# Patient Record
Sex: Female | Born: 1955
Health system: Southern US, Community
[De-identification: ages and names within clinical notes are randomized; demographics above are authoritative.]

## PROBLEM LIST (undated history)

## (undated) DIAGNOSIS — K5790 Diverticulosis of intestine, part unspecified, without perforation or abscess without bleeding: Secondary | ICD-10-CM

## (undated) DIAGNOSIS — D649 Anemia, unspecified: Secondary | ICD-10-CM

## (undated) DIAGNOSIS — I10 Essential (primary) hypertension: Secondary | ICD-10-CM

## (undated) DIAGNOSIS — T7840XA Allergy, unspecified, initial encounter: Secondary | ICD-10-CM

## (undated) HISTORY — DX: Allergy, unspecified, initial encounter: T78.40XA

## (undated) HISTORY — PX: TONSILLECTOMY: SUR1361

## (undated) HISTORY — DX: Essential (primary) hypertension: I10

## (undated) HISTORY — PX: COLONOSCOPY: SHX174

## (undated) HISTORY — PX: TRIGGER FINGER RELEASE: SHX641

## (undated) HISTORY — PX: GANGLION CYST EXCISION: SHX1691

## (undated) HISTORY — PX: OTHER SURGICAL HISTORY: SHX169

## (undated) HISTORY — PX: BREAST BIOPSY: SHX20

---

## 2007-08-01 ENCOUNTER — Ambulatory Visit: Payer: Self-pay | Admitting: Gastroenterology

## 2011-12-22 ENCOUNTER — Ambulatory Visit: Payer: Self-pay | Admitting: Surgery

## 2011-12-23 LAB — PATHOLOGY REPORT

## 2012-06-21 ENCOUNTER — Ambulatory Visit: Payer: Self-pay | Admitting: Surgery

## 2013-02-14 ENCOUNTER — Ambulatory Visit: Payer: Self-pay | Admitting: Surgery

## 2013-08-22 ENCOUNTER — Ambulatory Visit: Payer: Self-pay | Admitting: Orthopedic Surgery

## 2013-08-24 LAB — PATHOLOGY REPORT

## 2013-12-13 HISTORY — PX: BREAST BIOPSY: SHX20

## 2013-12-13 HISTORY — PX: BREAST EXCISIONAL BIOPSY: SUR124

## 2014-05-30 DIAGNOSIS — G5600 Carpal tunnel syndrome, unspecified upper limb: Secondary | ICD-10-CM | POA: Insufficient documentation

## 2014-07-22 ENCOUNTER — Encounter: Payer: Self-pay | Admitting: Orthopedic Surgery

## 2014-08-13 ENCOUNTER — Encounter: Payer: Self-pay | Admitting: Orthopedic Surgery

## 2014-09-09 ENCOUNTER — Ambulatory Visit: Payer: Self-pay | Admitting: Obstetrics and Gynecology

## 2014-09-16 ENCOUNTER — Ambulatory Visit: Payer: Self-pay | Admitting: Obstetrics and Gynecology

## 2014-09-23 ENCOUNTER — Ambulatory Visit: Payer: Self-pay | Admitting: Obstetrics and Gynecology

## 2014-09-25 LAB — PATHOLOGY REPORT

## 2014-09-26 ENCOUNTER — Emergency Department: Payer: Self-pay | Admitting: Internal Medicine

## 2014-10-24 LAB — HM COLONOSCOPY

## 2014-11-18 ENCOUNTER — Ambulatory Visit: Payer: Self-pay | Admitting: Surgery

## 2014-11-22 ENCOUNTER — Observation Stay: Payer: Self-pay | Admitting: Surgery

## 2014-11-22 ENCOUNTER — Ambulatory Visit: Payer: Self-pay | Admitting: Surgery

## 2014-11-22 LAB — CBC WITH DIFFERENTIAL/PLATELET
Basophil #: 0 10*3/uL (ref 0.0–0.1)
Basophil %: 0.7 %
Eosinophil #: 0 10*3/uL (ref 0.0–0.7)
Eosinophil %: 0 %
HCT: 40.2 % (ref 35.0–47.0)
HGB: 12.4 g/dL (ref 12.0–16.0)
LYMPHS ABS: 0.6 10*3/uL — AB (ref 1.0–3.6)
Lymphocyte %: 8.5 %
MCH: 25.7 pg — AB (ref 26.0–34.0)
MCHC: 30.8 g/dL — AB (ref 32.0–36.0)
MCV: 83 fL (ref 80–100)
MONO ABS: 0.1 x10 3/mm — AB (ref 0.2–0.9)
Monocyte %: 0.8 %
Neutrophil #: 6.1 10*3/uL (ref 1.4–6.5)
Neutrophil %: 90 %
Platelet: 285 10*3/uL (ref 150–440)
RBC: 4.82 10*6/uL (ref 3.80–5.20)
RDW: 14 % (ref 11.5–14.5)
WBC: 6.7 10*3/uL (ref 3.6–11.0)

## 2014-11-22 LAB — BASIC METABOLIC PANEL
Anion Gap: 9 (ref 7–16)
BUN: 13 mg/dL (ref 7–18)
CO2: 25 mmol/L (ref 21–32)
Calcium, Total: 8.5 mg/dL (ref 8.5–10.1)
Chloride: 107 mmol/L (ref 98–107)
Creatinine: 1.02 mg/dL (ref 0.60–1.30)
EGFR (African American): >60
GFR CALC NON AF AMER: 59 — AB
Glucose: 164 mg/dL — ABNORMAL HIGH (ref 65–99)
Osmolality: 285 (ref 275–301)
POTASSIUM: 4.1 mmol/L (ref 3.5–5.1)
Sodium: 141 mmol/L (ref 136–145)

## 2014-11-22 LAB — PROTIME-INR
INR: 1
Prothrombin Time: 12.8 secs (ref 11.5–14.7)

## 2015-04-04 NOTE — Op Note (Signed)
PATIENT NAME:  Chelsea [name], Chelsea [name] MR#:  [id number] DATE OF BIRTH:  19-Jun-1956  DATE OF PROCEDURE:  08/23/2013  PREOPERATIVE DIAGNOSIS: Right volar ganglion cyst.   POSTOPERATIVE DIAGNOSIS:  Right volar ganglion cyst.   PROCEDURE: Excision right volar ganglion cyst.   ANESTHESIA: General.   SURGEON: Leitha SchullerMichael J. Laticha Ferrucci, M.D.   DESCRIPTION OF PROCEDURE: The patient was brought to the operating room and after adequate anesthesia was obtained, the right arm was prepped and draped in the usual sterile fashion with a tourniquet applied to the upper arm. After patient identification and timeout procedures were completed. The tourniquet was raised to 250 mmHg. A curvilinear volar incision was made at the base of the scaphoid tubercle and extending extended proximally. The subcutaneous tissue was spread and the ganglion identified, grasped and then tracked down to the radial scaphoid joint. It  was then removed and its base cauterized to try to prevent recurrence. The tourniquet was let down at this point and the radial artery was intact. The cyst itself rose just ulnar to the FCR tendon. The wound was then irrigated and closed with simple interrupted 5-0 nylon skin sutures. Xeroform, 4 x 4's, Webril and a volar splint were applied along with an Ace wrap, and the patient was sent to the recovery room in stable condition.   ESTIMATED BLOOD LOSS: Minimal.   TOURNIQUET TIME: Eight minutes at 250 mmHg.   SPECIMEN: Removed ganglion cyst.   ____________________________ Leitha SchullerMichael J. Latise Dilley, MD mjm:cc D: 08/23/2013 02:02:12 ET [name]: 08/23/2013 03:02:25 ET JOB#: [id number]  cc: Leitha SchullerMichael J. Janika Jedlicka, MD, <Dictator> Leitha SchullerMICHAEL J Bishoy Cupp MD ELECTRONICALLY SIGNED 08/23/2013 11:12

## 2015-04-05 NOTE — Op Note (Signed)
PATIENT NAME:  [name], Chelsea T MR#:  [id number] DATE OF BIRTH:  11-22-1956  DATE OF PROCEDURE:  11/22/2014  PREOPERATIVE DIAGNOSIS: Intraductal papilloma of the right breast.   POSTOPERATIVE DIAGNOSIS: Intraductal papilloma of the right breast.   PROCEDURE: Excision of right breast mass.   SURGEON: Adella HareJ. Wilton Samina Weekes, MD.   ANESTHESIA: General.   INDICATIONS: This 59 year old female recently had a mammogram depicting a small density in the medial aspect of the right breast in the lower inner quadrant. Ultrasound demonstrated a 6 x 5 x 6 mm hypoechoic mass with indistinct margins. Ultrasound-guided needle biopsy demonstrated intraductal papilloma associated with microcalcifications. Excision of the mass was recommended for further treatment. She did have preoperative insertion of a Kopans wire with followup mammograms, which I have reviewed demonstrating location of the biopsy marker and the Kopans wire in the medial aspect of the right breast.   The patient was placed on the operating table in the supine position under general anesthesia. The dressing was removed from the right breast exposing the Kopans wire which entered the peripheral aspect of the breast at approximately 2:30 position and extended laterally and identified the close proximity of the wire to the biopsy marker. The wire was cut 2 cm from the skin. The breast was prepared with ChloraPrep and draped in a sterile manner.   A curvilinear incision was made from approximately 2-o'clock to 4-o'clock position of the right breast which was approximately 4 cm in length, carried down through subcutaneous tissues and dissected down to encounter the wire. Next, a portion of tissue surrounding the wire was resected. This did extend from medial to lateral. There was a pocket of what appeared to be old clotted blood, which this entire pocket was excised and the actual specimen was some 5 cm in length and approximately 1.5 cm in width and was  submitted for specimen mammogram and routine pathology. The wound was inspected. Several small bleeding points were cauterized. Some of the tissues in the retroareolar portion were approximated with 4-0 chromic. The subcutaneous tissues were infiltrated with 0.5% Sensorcaine with epinephrine. The subcutaneous tissues were approximated with 3-0 chromic. The skin was closed with running 4-0 Monocryl subcuticular suture and LiquiBand. The LiquiBand was allowed to dry. The patient appeared to tolerate the procedure satisfactorily and was prepared for transfer to the recovery room.   ____________________________ [name]. Renda RollsWilton Contrina Orona, MD jws:at D: 11/22/2014 11:17:07 ET T: 11/22/2014 17:45:59 ET JOB#: [id number]  cc: Adella HareJ. Wilton Kelse Ploch, MD, <Dictator> Adella HareWILTON J John Williamsen MD ELECTRONICALLY SIGNED 11/22/2014 19:28

## 2015-04-05 NOTE — Op Note (Signed)
PATIENT NAME:  [name], Chelsea [name] DATE OF BIRTH:  April 08, 1956  DATE OF PROCEDURE:  11/22/2014  PREOPERATIVE DIAGNOSIS: Hematoma of right breast.   POSTOPERATIVE DIAGNOSIS: Hematoma of right breast.   PROCEDURE: Incision and drainage of hematoma of right breast.   SURGEON: Renda RollsWilton Smith, MD.   ANESTHESIA: General.   INDICATIONS: This 59 year old female had had surgery earlier in the day to remove an intraductal papilloma of the medial aspect of the right breast. She called from home indicating that she had a lot of swelling and some bleeding and was brought in through the Emergency Room and brought to the operating room with marked swelling of the right breast.   DESCRIPTION OF PROCEDURE:  The patient was placed on the operating table in the supine position under general anesthesia. The right breast was prepared with ChloraPrep and draped in a sterile manner.   There was a lot of swelling of the right breast. The glue was removed from the incision of the medial aspect of the right breast. This curvilinear incision was just outside of the areola and medial to the areola and approximately 4 cm in length. The incision was opened with a scalpel and evacuated a large amount of clotted blood, also aspirated some non-clotted blood. The wound was inspected and did not see any active bleeding point. All of the hematoma was evacuated which the combination of clotted blood and liquid blood which was aspirated amounted to approximately 150 mL. The wound was inspected over a period of several minutes. Several tiny points were coagulated but there was no active bleeding seen. A Blake drain was inserted through a separate inferior medial stab wound and cut to fit and secured to the skin with 3-0 nylon stitch. The subcutaneous tissues were closed with interrupted 4-0 Chromic, the skin was closed with running 4-0 Monocryl subcuticular suture and LiquiBand. Dressing was applied around the drain site using  silk tape to hold it in place.   The patient appeared to tolerate the procedure satisfactorily and was prepared for transfer to the recovery room.    ____________________________ [name]. Renda RollsWilton Smith, MD jws:bu D: 11/25/2014 17:50:00 ET T: 11/25/2014 21:14:15 ET JOB#: [id number]  cc: Adella HareJ. Wilton Smith, MD, <Dictator>

## 2015-04-05 NOTE — Op Note (Signed)
PATIENT NAME:  Chelsea [name], Chelsea [name] MR#:  [id number] DATE OF BIRTH:  1956/11/04  DATE OF PROCEDURE:  11/22/2014  PREOPERATIVE DIAGNOSIS: Hematoma of right breast.   POSTOPERATIVE DIAGNOSIS: Hematoma of right breast.   PROCEDURE: Incision and drainage of hematoma of right breast.   SURGEON: Renda RollsWilton Charod Slawinski, MD.   ANESTHESIA: General.   INDICATIONS: This 59 year old female had had surgery earlier in the day to remove an intraductal papilloma of the medial aspect of the right breast. She called from home indicating that she had a lot of swelling and some bleeding and was brought in through the Emergency Room and brought to the operating room with marked swelling of the right breast.   DESCRIPTION OF PROCEDURE:  The patient was placed on the operating table in the supine position under general anesthesia. The right breast was prepared with ChloraPrep and draped in a sterile manner.   There was a lot of swelling of the right breast. The glue was removed from the incision of the medial aspect of the right breast. This curvilinear incision was just outside of the areola and medial to the areola and approximately 4 cm in length. The incision was opened with a scalpel and evacuated a large amount of clotted blood, also aspirated some non-clotted blood. The wound was inspected and did not see any active bleeding point. All of the hematoma was evacuated which the combination of clotted blood and liquid blood which was aspirated amounted to approximately 150 mL. The wound was inspected over a period of several minutes. Several tiny points were coagulated but there was no active bleeding seen. A Blake drain was inserted through a separate inferior medial stab wound and cut to fit and secured to the skin with 3-0 nylon stitch. The subcutaneous tissues were closed with interrupted 4-0 Chromic, the skin was closed with running 4-0 Monocryl subcuticular suture and LiquiBand. Dressing was applied around the drain site using  silk tape to hold it in place.   The patient appeared to tolerate the procedure satisfactorily and was prepared for transfer to the recovery room.     ____________________________ [name]. Renda RollsWilton Azul Coffie, MD jws:bu D: 11/25/2014 17:50:51 ET [name]: 11/25/2014 21:15:46 ET JOB#: [id number]  cc: Adella HareJ. Wilton Rolene Andrades, MD, <Dictator> Adella HareWILTON J Delphia Kaylor MD ELECTRONICALLY SIGNED 11/28/2014 9:36

## 2015-04-07 LAB — SURGICAL PATHOLOGY

## 2016-02-07 DIAGNOSIS — Y9289 Other specified places as the place of occurrence of the external cause: Secondary | ICD-10-CM | POA: Insufficient documentation

## 2016-02-07 DIAGNOSIS — S8992XA Unspecified injury of left lower leg, initial encounter: Secondary | ICD-10-CM | POA: Diagnosis present

## 2016-02-07 DIAGNOSIS — Z79899 Other long term (current) drug therapy: Secondary | ICD-10-CM | POA: Diagnosis not present

## 2016-02-07 DIAGNOSIS — S83207A Unspecified tear of unspecified meniscus, current injury, left knee, initial encounter: Secondary | ICD-10-CM | POA: Diagnosis not present

## 2016-02-07 DIAGNOSIS — Y998 Other external cause status: Secondary | ICD-10-CM | POA: Insufficient documentation

## 2016-02-07 DIAGNOSIS — Y9389 Activity, other specified: Secondary | ICD-10-CM | POA: Insufficient documentation

## 2016-02-07 DIAGNOSIS — X58XXXA Exposure to other specified factors, initial encounter: Secondary | ICD-10-CM | POA: Diagnosis not present

## 2016-02-08 ENCOUNTER — Emergency Department: Payer: BLUE CROSS/BLUE SHIELD

## 2016-02-08 ENCOUNTER — Encounter: Payer: Self-pay | Admitting: Emergency Medicine

## 2016-02-08 ENCOUNTER — Emergency Department
Admission: EM | Admit: 2016-02-08 | Discharge: 2016-02-08 | Disposition: A | Discharge status: Home / Self Care | Payer: BLUE CROSS/BLUE SHIELD | Attending: Emergency Medicine | Admitting: Emergency Medicine

## 2016-02-08 DIAGNOSIS — M2392 Unspecified internal derangement of left knee: Secondary | ICD-10-CM

## 2016-02-08 DIAGNOSIS — M79652 Pain in left thigh: Secondary | ICD-10-CM

## 2016-02-08 MED ORDER — OXYCODONE-ACETAMINOPHEN 5-325 MG PO TABS
ORAL_TABLET | ORAL | Status: AC
  Filled 2016-02-08: qty 1

## 2016-02-08 MED ORDER — OXYCODONE-ACETAMINOPHEN 5-325 MG PO TABS
1.0000 | ORAL_TABLET | Freq: Once | ORAL | Status: AC
  Administered 2016-02-08: 1 via ORAL

## 2016-02-08 NOTE — ED Notes (Signed)
Pt states at 2100 pt was ambulating when she went to take a step up a raised step when she felt a "pop" in her right thigh. Pt states now is unable to bear weight on leg and has pain in right thigh. Cms intact to toes, no deformity or swelling noted.

## 2016-02-08 NOTE — ED Provider Notes (Signed)
[facility] Emergency Department Provider Note  ____________________________________________  Time seen: Approximately 1:59 AM  I have reviewed the triage vital signs and the nursing notes.   HISTORY  Chief Complaint Leg Pain    HPI TAMALYN WADSWORTH is a 60 y.o. female who is generally healthy other than a history of obesity who presents with acute onset of pain and swelling just above her left knee.  She reports that her knee has been sore recently but it is generally behind her knee.  However earlier tonight she was walking up a couple of steps and she felt a pop just above the knee and had acute onset of intense, severe pain.  She feels like she is unable to bend her knee due to the pain and she cannot bear weight on it.  Flexing the knee worsens the pain and keeping it still and straight makes it feel better.She did not sustain any other injuries and has no pain in any other locations.  She did have immediate swelling just above the left knee.  She denies fever/chills, chest pain, shortness of breath, abdominal pain, nausea, vomiting, dysuria.  She denies any orthopedic injuries in the past although her past surgical history does indicate a nonspecific arm surgery.   History reviewed. No pertinent past medical history.  There are no active problems to display for this patient.   Past Surgical History  Procedure Laterality Date  . Arm surgery      Current Outpatient Rx  Name  Route  Sig  Dispense  Refill  . calcium carbonate (OS-CAL - DOSED IN MG OF ELEMENTAL CALCIUM) 1250 (500 Ca) MG tablet   Oral   Take 1 tablet by mouth daily.         . cholecalciferol (VITAMIN D) 1000 units tablet   Oral   Take 1,000 Units by mouth daily.           Allergies Review of patient's allergies indicates no known allergies.  No family history on file.  Social History Social History  Substance Use Topics  . Smoking status: Never Smoker   . Smokeless tobacco:  Never Used  . Alcohol Use: No    Review of Systems Constitutional: No fever/chills Eyes: No visual changes. ENT: No sore throat. Cardiovascular: Denies chest pain. Respiratory: Denies shortness of breath. Gastrointestinal: No abdominal pain.  No nausea, no vomiting.  No diarrhea.  No constipation. Genitourinary: Negative for dysuria. Musculoskeletal: Acute onset severe pain and swelling superior to the left patella when walking up stairs Skin: Negative for rash. Neurological: Negative for headaches, focal weakness or numbness.  10-point ROS otherwise negative.  ____________________________________________   PHYSICAL EXAM:  VITAL SIGNS: ED Triage Vitals  Enc Vitals Group     BP 02/08/16 0010 162/91 mmHg     Pulse Rate 02/08/16 0010 70     Resp 02/08/16 0010 16     Temp 02/08/16 0010 98.1 F (36.7 C)     Temp Source 02/08/16 0010 Oral     SpO2 02/08/16 0010 98 %     Weight 02/08/16 0010 180 lb (81.647 kg)     Height 02/08/16 0010 5' (1.524 m)     Head Cir --      Peak Flow --      Pain Score 02/08/16 0011 8     Pain Loc --      Pain Edu? --      Excl. in GC? --     Constitutional: Alert and  oriented. Well appearing and in no acute distress. Eyes: Conjunctivae are normal. PERRL. EOMI. Head: Atraumatic. Cardiovascular: Normal rate, regular rhythm. Grossly normal heart sounds.  Good peripheral circulation. Respiratory: Normal respiratory effort.  No retractions. Lungs CTAB. Gastrointestinal: Soft and nontender. No distention. No abdominal bruits. No CVA tenderness. Musculoskeletal: Suprapatellar effusion of the left leg with no erythema and no warmth that would suggest an infectious process.  It is moderately tender to palpation.  She has severe tenderness with passive flexion of the knee.  There is no pain or tenderness in the femur or the left hip. Neurologic:  Normal speech and language. No gross focal neurologic deficits are appreciated.  Skin:  Skin is warm, dry and  intact. No rash noted. Psychiatric: Mood and affect are normal. Speech and behavior are normal.  ____________________________________________   LABS (all labs ordered are listed, but only abnormal results are displayed)  Labs Reviewed - No data to display ____________________________________________  EKG  None ____________________________________________  RADIOLOGY   Dg Knee Complete 4 Views Left  02/08/2016  CLINICAL DATA:  Felt pop in left knee while going up steps, with pain and swelling above the patella. Initial encounter. EXAM: LEFT KNEE - COMPLETE 4+ VIEW COMPARISON:  None. FINDINGS: There is no evidence of fracture or dislocation. The joint spaces are preserved. No significant degenerative change is seen; the patellofemoral joint is grossly unremarkable in appearance. A large knee joint effusion is noted. Mild edema is noted at Hoffa's fat pad. IMPRESSION: 1. No evidence of fracture or dislocation. 2. Large knee joint effusion noted. Mild edema at Hoffa's fat pad. MRI could be considered for further evaluation, to assess for internal derangement of the knee. Electronically Signed   By: Roanna Raider M.D.   On: 02/08/2016 02:28    ____________________________________________   PROCEDURES  Procedure(s) performed: None  Critical Care performed: No ____________________________________________   INITIAL IMPRESSION / ASSESSMENT AND PLAN / ED COURSE  Pertinent labs & imaging results that were available during my care of the patient were reviewed by me and considered in my medical decision making (see chart for details).  The patient has no bony abnormality or dislocation on her x-ray but it does demonstrate the same large effusion that we can see clinically on exam.  I called and spoke with Dr. Rexanne Mano who recommended a knee immobilizer and close outpatient follow-up within the next business day.  I advised the patient of these recommendations and she knows to follow up on  Monday morning.  I gave my usual and customary return precautions.     ____________________________________________  FINAL CLINICAL IMPRESSION(S) / ED DIAGNOSES  Final diagnoses:  Internal derangement of knee, left      NEW MEDICATIONS STARTED DURING THIS VISIT:  New Prescriptions   No medications on file      Note:  This document was prepared using Dragon voice recognition software and may include unintentional dictation errors.   Loleta Rose, MD 02/08/16 0430

## 2016-02-08 NOTE — ED Notes (Signed)
Pt discharged to home w/ crutches and knee immobilizer by Quentin Mulling, RN.

## 2016-02-08 NOTE — ED Notes (Signed)
MD Forbach at bedside. 

## 2016-02-08 NOTE — Discharge Instructions (Signed)
As we discussed, you need to follow-up with Dr. Rosita Kea on Monday morning for reevaluation of your left knee injury.  Please use the knee immobilizer as much as possible between now and then.  Use over-the-counter pain medication as needed.

## 2016-02-08 NOTE — ED Notes (Signed)
Pt c/o of pain/swelling immediately superior to the left knee. Pt reports she was walking at approx 9pm and heard a popping noise. She has had intense pain, and inability to bear weight on leg since.

## 2016-02-23 ENCOUNTER — Other Ambulatory Visit: Payer: Self-pay | Admitting: Orthopedic Surgery

## 2016-02-23 DIAGNOSIS — S83232A Complex tear of medial meniscus, current injury, left knee, initial encounter: Secondary | ICD-10-CM

## 2016-03-12 ENCOUNTER — Ambulatory Visit
Admission: RE | Admit: 2016-03-12 | Discharge: 2016-03-12 | Disposition: A | Discharge status: Home / Self Care | Payer: BLUE CROSS/BLUE SHIELD | Source: Ambulatory Visit | Attending: Orthopedic Surgery | Admitting: Orthopedic Surgery

## 2016-03-12 DIAGNOSIS — S83232A Complex tear of medial meniscus, current injury, left knee, initial encounter: Secondary | ICD-10-CM | POA: Diagnosis not present

## 2016-03-12 DIAGNOSIS — M25462 Effusion, left knee: Secondary | ICD-10-CM | POA: Insufficient documentation

## 2016-03-12 DIAGNOSIS — X58XXXA Exposure to other specified factors, initial encounter: Secondary | ICD-10-CM | POA: Diagnosis not present

## 2016-04-15 ENCOUNTER — Encounter: Payer: Self-pay | Admitting: *Deleted

## 2016-04-15 ENCOUNTER — Other Ambulatory Visit: Payer: BLUE CROSS/BLUE SHIELD

## 2016-04-15 MED ORDER — SILVER SULFADIAZINE 1 % EX CREA
TOPICAL_CREAM | CUTANEOUS | Status: AC
  Filled 2016-04-15: qty 85

## 2016-04-15 NOTE — Patient Instructions (Signed)
  Your procedure is scheduled on:04-22-16 Report to [facility] 2ND FLOOR To find out your arrival time please call [phone] between 1PM - 3PM on 04-21-16  Remember: Instructions that are not followed completely may result in serious medical risk, up to and including death, or upon the discretion of your surgeon and anesthesiologist your surgery may need to be rescheduled.    _X___ 1. Do not eat food or drink liquids after midnight. No gum chewing or hard candies.     _X___ 2. No Alcohol for 24 hours before or after surgery.   ____ 3. Bring all medications with you on the day of surgery if instructed.    ____ 4. Notify your doctor if there is any change in your medical condition     (cold, fever, infections).     Do not wear jewelry, make-up, hairpins, clips or nail polish.  Do not wear lotions, powders, or perfumes. You may wear deodorant.  Do not shave 48 hours prior to surgery. Men may shave face and neck.  Do not bring valuables to the hospital.    [facility] is not responsible for any belongings or valuables.               Contacts, dentures or bridgework may not be worn into surgery.  Leave your suitcase in the car. After surgery it may be brought to your room.  For patients admitted to the hospital, discharge time is determined by your treatment team.   Patients discharged the day of surgery will not be allowed to drive home.   Please read over the following fact sheets that you were given:      ____ Take these medicines the morning of surgery with A SIP OF WATER:    1. NONE  2.   3.   4.  5.  6.  ____ Fleet Enema (as directed)   ____ Use CHG Soap as directed  ____ Use inhalers on the day of surgery  ____ Stop metformin 2 days prior to surgery    ____ Take 1/2 of usual insulin dose the night before surgery and none on the morning of surgery.   ____ Stop Coumadin/Plavix/aspirin-N/A  _X___ Stop Anti-inflammatories-NO NSAIDS OR ASA  PRODUCTS-TYLENOL OK TO TAKE   _X___ Stop supplements until after surgery-STOP FISH OIL NOW  ____ Bring C-Pap to the hospital.

## 2016-04-22 ENCOUNTER — Encounter
Admission: RE | Disposition: A | Discharge status: Home / Self Care | Payer: Self-pay | Source: Ambulatory Visit | Attending: Orthopedic Surgery

## 2016-04-22 ENCOUNTER — Ambulatory Visit: Payer: BLUE CROSS/BLUE SHIELD | Admitting: Anesthesiology

## 2016-04-22 ENCOUNTER — Ambulatory Visit
Admission: RE | Admit: 2016-04-22 | Discharge: 2016-04-22 | Disposition: A | Discharge status: Home / Self Care | Payer: BLUE CROSS/BLUE SHIELD | Source: Ambulatory Visit | Attending: Orthopedic Surgery | Admitting: Orthopedic Surgery

## 2016-04-22 DIAGNOSIS — M23222 Derangement of posterior horn of medial meniscus due to old tear or injury, left knee: Secondary | ICD-10-CM | POA: Insufficient documentation

## 2016-04-22 DIAGNOSIS — I10 Essential (primary) hypertension: Secondary | ICD-10-CM | POA: Diagnosis not present

## 2016-04-22 DIAGNOSIS — Z79899 Other long term (current) drug therapy: Secondary | ICD-10-CM | POA: Diagnosis not present

## 2016-04-22 HISTORY — DX: Diverticulosis of intestine, part unspecified, without perforation or abscess without bleeding: K57.90

## 2016-04-22 HISTORY — PX: KNEE ARTHROSCOPY WITH MEDIAL MENISECTOMY: SHX5651

## 2016-04-22 SURGERY — ARTHROSCOPY, KNEE, WITH MEDIAL MENISCECTOMY
Anesthesia: General | Site: Knee | Laterality: Left | Wound class: Clean

## 2016-04-22 MED ORDER — ONDANSETRON HCL 4 MG/2ML IJ SOLN
4.0000 mg | Freq: Once | INTRAMUSCULAR | Status: DC | PRN
Start: 2016-04-22 — End: 2016-04-22

## 2016-04-22 MED ORDER — LIDOCAINE HCL (CARDIAC) 20 MG/ML IV SOLN
INTRAVENOUS | Status: DC | PRN
  Administered 2016-04-22: 100 mg via INTRAVENOUS

## 2016-04-22 MED ORDER — PHENYLEPHRINE HCL 10 MG/ML IJ SOLN
INTRAMUSCULAR | Status: DC | PRN
  Administered 2016-04-22: 100 ug via INTRAVENOUS

## 2016-04-22 MED ORDER — MIDAZOLAM HCL 2 MG/2ML IJ SOLN
INTRAMUSCULAR | Status: DC | PRN
  Administered 2016-04-22: 2 mg via INTRAVENOUS

## 2016-04-22 MED ORDER — LACTATED RINGERS IV SOLN
INTRAVENOUS | Status: DC
  Administered 2016-04-22 (×2): via INTRAVENOUS

## 2016-04-22 MED ORDER — KETOROLAC TROMETHAMINE 30 MG/ML IJ SOLN
INTRAMUSCULAR | Status: DC | PRN
  Administered 2016-04-22: 30 mg via INTRAVENOUS

## 2016-04-22 MED ORDER — FENTANYL CITRATE (PF) 100 MCG/2ML IJ SOLN
INTRAMUSCULAR | Status: DC | PRN
  Administered 2016-04-22: 100 ug via INTRAVENOUS

## 2016-04-22 MED ORDER — HYDROCODONE-ACETAMINOPHEN 5-325 MG PO TABS: 1.0000 | ORAL_TABLET | Freq: Four times a day (QID) | ORAL | Status: DC | PRN

## 2016-04-22 MED ORDER — ONDANSETRON HCL 4 MG/2ML IJ SOLN
INTRAMUSCULAR | Status: DC | PRN
  Administered 2016-04-22: 4 mg via INTRAVENOUS

## 2016-04-22 MED ORDER — BUPIVACAINE-EPINEPHRINE (PF) 0.5% -1:200000 IJ SOLN
INTRAMUSCULAR | Status: DC | PRN
  Administered 2016-04-22: 20 mL

## 2016-04-22 MED ORDER — PROPOFOL 10 MG/ML IV BOLUS
INTRAVENOUS | Status: DC | PRN
  Administered 2016-04-22: 160 mg via INTRAVENOUS

## 2016-04-22 MED ORDER — FAMOTIDINE 20 MG PO TABS
ORAL_TABLET | ORAL | Status: AC
Start: 2016-04-22 — End: 2016-04-22
  Administered 2016-04-22: 20 mg via ORAL
  Filled 2016-04-22: qty 1

## 2016-04-22 MED ORDER — FAMOTIDINE 20 MG PO TABS
20.0000 mg | ORAL_TABLET | Freq: Once | ORAL | Status: AC
  Administered 2016-04-22: 20 mg via ORAL

## 2016-04-22 MED ORDER — DEXAMETHASONE SODIUM PHOSPHATE 10 MG/ML IJ SOLN
INTRAMUSCULAR | Status: DC | PRN
  Administered 2016-04-22: 10 mg via INTRAVENOUS

## 2016-04-22 MED ORDER — FENTANYL CITRATE (PF) 100 MCG/2ML IJ SOLN: 25.0000 ug | INTRAMUSCULAR | Status: DC | PRN

## 2016-04-22 MED ORDER — BUPIVACAINE HCL (PF) 0.5 % IJ SOLN
INTRAMUSCULAR | Status: AC
  Filled 2016-04-22: qty 30

## 2016-04-22 MED ORDER — BUPIVACAINE-EPINEPHRINE (PF) 0.5% -1:200000 IJ SOLN
INTRAMUSCULAR | Status: AC
  Filled 2016-04-22: qty 30

## 2016-04-22 SURGICAL SUPPLY — 27 items
BANDAGE ACE 4X5 VEL STRL LF (GAUZE/BANDAGES/DRESSINGS) ×3 IMPLANT
BANDAGE ELASTIC 4 LF NS (GAUZE/BANDAGES/DRESSINGS) ×3 IMPLANT
BLADE FULL RADIUS 3.5 (BLADE) IMPLANT
BLADE INCISOR PLUS 4.5 (BLADE) IMPLANT
BLADE SHAVER 4.5 DBL SERAT CV (CUTTER) IMPLANT
BLADE SHAVER 4.5X7 STR FR (MISCELLANEOUS) IMPLANT
CHLORAPREP W/TINT 26ML (MISCELLANEOUS) ×3 IMPLANT
CUFF TOURN 24 STER (MISCELLANEOUS) IMPLANT
CUFF TOURN 30 STER DUAL PORT (MISCELLANEOUS) ×3 IMPLANT
CUTTER AGGRESSIVE+ 3.5 (CUTTER) ×3 IMPLANT
GAUZE SPONGE 4X4 12PLY STRL (GAUZE/BANDAGES/DRESSINGS) ×3 IMPLANT
GLOVE SURG ORTHO 9.0 STRL STRW (GLOVE) ×3 IMPLANT
GOWN STRL REUS W/ TWL LRG LVL3 (GOWN DISPOSABLE) ×1 IMPLANT
GOWN STRL REUS W/TWL LRG LVL3 (GOWN DISPOSABLE) ×2
GOWN SURG XXL (GOWNS) ×3 IMPLANT
IV LACTATED RINGER IRRG 3000ML (IV SOLUTION) ×8
IV LR IRRIG 3000ML ARTHROMATIC (IV SOLUTION) ×4 IMPLANT
KIT RM TURNOVER STRD PROC AR (KITS) ×3 IMPLANT
MANIFOLD NEPTUNE II (INSTRUMENTS) ×3 IMPLANT
PACK ARTHROSCOPY KNEE (MISCELLANEOUS) ×3 IMPLANT
SET TUBE SUCT SHAVER OUTFL 24K (TUBING) ×3 IMPLANT
SET TUBE TIP INTRA-ARTICULAR (MISCELLANEOUS) ×3 IMPLANT
SUT ETHILON 4-0 (SUTURE) ×2
SUT ETHILON 4-0 FS2 18XMFL BLK (SUTURE) ×1
SUTURE ETHLN 4-0 FS2 18XMF BLK (SUTURE) ×1 IMPLANT
TUBING ARTHRO INFLOW-ONLY STRL (TUBING) ×3 IMPLANT
WAND HAND CNTRL MULTIVAC 50 (MISCELLANEOUS) ×3 IMPLANT

## 2016-04-22 NOTE — Anesthesia Preprocedure Evaluation (Signed)
Anesthesia Evaluation  Patient identified by MRN, date of birth, ID band Patient awake    Reviewed: Allergy & Precautions, NPO status , Patient's Chart, lab work & pertinent test results  Airway Mallampati: II       Dental  (+) Upper Dentures   Pulmonary neg pulmonary ROS,    breath sounds clear to auscultation       Cardiovascular Exercise Tolerance: Good hypertension, Pt. on medications  Rhythm:Regular Rate:Normal     Neuro/Psych negative neurological ROS     GI/Hepatic negative GI ROS, Neg liver ROS,   Endo/Other  negative endocrine ROS  Renal/GU negative Renal ROS     Musculoskeletal negative musculoskeletal ROS (+)   Abdominal Normal abdominal exam  (+)   Peds  Hematology negative hematology ROS (+)   Anesthesia Other Findings   Reproductive/Obstetrics                             Anesthesia Physical Anesthesia Plan  ASA: II  Anesthesia Plan: General   Post-op Pain Management:    Induction: Intravenous  Airway Management Planned: LMA  Additional Equipment:   Intra-op Plan:   Post-operative Plan:   Informed Consent: I have reviewed the patients History and Physical, chart, labs and discussed the procedure including the risks, benefits and alternatives for the proposed anesthesia with the patient or authorized representative who has indicated his/her understanding and acceptance.     Plan Discussed with: CRNA  Anesthesia Plan Comments:         Anesthesia Quick Evaluation

## 2016-04-22 NOTE — Discharge Instructions (Signed)
Weightbearing as tolerated on leg. Aspirin 1 daily until walking normally. Keep dressing clean and dry. Dressing size down leg remove dressing put Band-Aids on the 2 incisions and rewrap Ace wrap  AMBULATORY SURGERY  DISCHARGE INSTRUCTIONS   1) The drugs that you were given will stay in your system until tomorrow so for the next 24 hours you should not:  A) Drive an automobile B) Make any legal decisions C) Drink any alcoholic beverage  2) You may resume regular meals tomorrow.  Today it is better to start with liquids and gradually work up to solid foods.  You may eat anything you prefer, but it is better to start with liquids, then soup and crackers, and gradually work up to solid foods.  3) Please notify your doctor immediately if you have any unusual bleeding, trouble breathing, redness and pain at the surgery site, drainage, fever, or pain not relieved by medication.   4) Additional Instructions: A STOOL SOFTENER IS RECOMMENDED WHILE TAKING PAIN MEDICINE  Please contact your physician with any problems or Same Day Surgery at [phone], Monday through Friday 6 am to 4 pm, or [place] at [facility] Main number at [phone].

## 2016-04-22 NOTE — Anesthesia Postprocedure Evaluation (Signed)
Anesthesia Post Note  Patient: Arliss JourneyDoreen T Maldonado  Procedure(s) Performed: Procedure(s) (LRB): KNEE ARTHROSCOPY WITH MEDIAL MENISECTOMY (Left)  Patient location during evaluation: PACU Anesthesia Type: General Level of consciousness: awake Pain management: pain level controlled Vital Signs Assessment: post-procedure vital signs reviewed and stable Respiratory status: nonlabored ventilation Cardiovascular status: stable Anesthetic complications: no    Last Vitals:  Filed Vitals:   04/22/16 1025 04/22/16 1030  BP:  125/72  Pulse: 78 78  Temp:    Resp: 11 11    Last Pain: There were no vitals filed for this visit.               VAN STAVEREN,Gimena Buick

## 2016-04-22 NOTE — OR Nursing (Signed)
Anesthesia made aware of medications (Dr Mordecai RasmussenVanstavern) no order for potassium at this time.

## 2016-04-22 NOTE — Transfer of Care (Signed)
Immediate Anesthesia Transfer of Care Note  Patient: Chelsea [name]  Procedure(s) Performed: Procedure(s): KNEE ARTHROSCOPY WITH MEDIAL MENISECTOMY (Left)  Patient Location: PACU  Anesthesia Type:General  Level of Consciousness: sedated  Airway & Oxygen Therapy: Patient Spontanous Breathing and Patient connected to face mask oxygen  Post-op Assessment: Report given to RN and Post -op Vital signs reviewed and stable  Post vital signs: Reviewed and stable  Last Vitals:  Filed Vitals:   04/22/16 0846  BP: 148/87  Pulse: 84  Temp: 36.8 C  Resp: 16    Last Pain: There were no vitals filed for this visit.       Complications: No apparent anesthesia complications

## 2016-04-22 NOTE — H&P (Signed)
Reviewed paper H+P, will be scanned into chart. No changes noted.  

## 2016-04-22 NOTE — Op Note (Signed)
04/22/2016  10:18 AM  PATIENT:  Chelsea [name]  60 y.o. female  PRE-OPERATIVE DIAGNOSIS:  COMPLETE TEAR MEDIAL MENISCUS  POST-OPERATIVE DIAGNOSIS:  COMPLETE TEAR MEDIAL MENISCUS  PROCEDURE:  Procedure(s): KNEE ARTHROSCOPY WITH MEDIAL MENISECTOMY (Left)  SURGEON: Leitha SchullerMichael J Roma Bondar, MD  ASSISTANTS: None  ANESTHESIA:   general  EBL:  Total I/O In: 550 [I.V.:550] Out: 5 [Blood:5]  BLOOD ADMINISTERED:none  DRAINS: none   LOCAL MEDICATIONS USED:  MARCAINE     SPECIMEN:  No Specimen  DISPOSITION OF SPECIMEN:  N/A  COUNTS:  YES  TOURNIQUET:    IMPLANTS: None  DICTATION: .Dragon Dictation patient was brought to the operating room and after adequate anesthesia was obtained, the left leg was prepped and draped in sterile fashion. After patient identification and timeout procedures were completed, an inferior lateral portal was made and the arthroscope was introduced. Patellofemoral joint was normal in appearance. Coming around medially there were no loose bodies although there was some fragments of cartilage adherent to the synovium consistent with degenerative change and loose articular cartilage. Coming the medial compartment inferior medial portal was made and on probing there was a tear to the posterior portion of the meniscus posterior third this was debrided with meniscal punch and ArthriCare wand to a stable margin. Going there was I'll areas of partial-thickness cartilage loss and fibrillation on the tibia and femur. The anterior cruciate ligament was intact lateral compartment had fissuring and more extensive wear but the meniscus was intact to probing and direct visualization. After having addressed the medial meniscus tear of the gutters were checked and there were no loose bodies the knee was thoroughly irrigated all argentation was withdrawn. Wounds were infiltrated with 10 cc half percent Sensorcaine to each knee incision and then dressings applied with Xeroform 4 x 4 web roll  and Ace wrap. Pre-and postprocedure arthroscopy pictures were taken  PLAN OF CARE: Discharge to home after PACU  PATIENT DISPOSITION:  PACU - hemodynamically stable.

## 2016-04-22 NOTE — Anesthesia Procedure Notes (Signed)
Procedure Name: LMA Insertion Date/Time: 04/22/2016 9:57 AM Performed by: Junious SilkNOLES, Paidyn Mcferran Pre-anesthesia Checklist: Patient identified, Patient being monitored, Timeout performed, Emergency Drugs available and Suction available Patient Re-evaluated:Patient Re-evaluated prior to inductionOxygen Delivery Method: Circle system utilized Preoxygenation: Pre-oxygenation with 100% oxygen Intubation Type: IV induction Ventilation: Mask ventilation without difficulty LMA: LMA inserted LMA Size: 3.5 Tube type: Oral Number of attempts: 1 Placement Confirmation: positive ETCO2 and breath sounds checked- equal and bilateral Tube secured with: Tape Dental Injury: Teeth and Oropharynx as per pre-operative assessment

## 2016-04-23 ENCOUNTER — Encounter: Payer: Self-pay | Admitting: Orthopedic Surgery

## 2017-04-27 IMAGING — MR MR KNEE*L* W/O CM
5 series · 40 of 40 positions shown · non-contrast
Comparison: Plain films left knee 02/08/2016.

CLINICAL DATA: The patient heard a pop in her left knee when going
up stairs 1 month ago with onset of pain and swelling. Subsequent
encounter.

EXAM:
MRI OF THE LEFT KNEE WITHOUT CONTRAST
TECHNIQUE: Multiplanar, multisequence MR imaging of the knee was performed. No
intravenous contrast was administered.

[Series 3: PD fat-sat · axial · 3.0mm · 0.29mm/px · z∈[-65,+50]mm · 8 of 36 slices shown (1 of 3)]
[im 1/36]
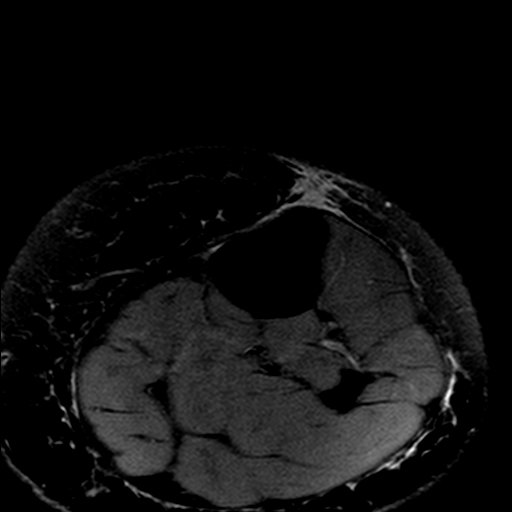
[im 6/36]
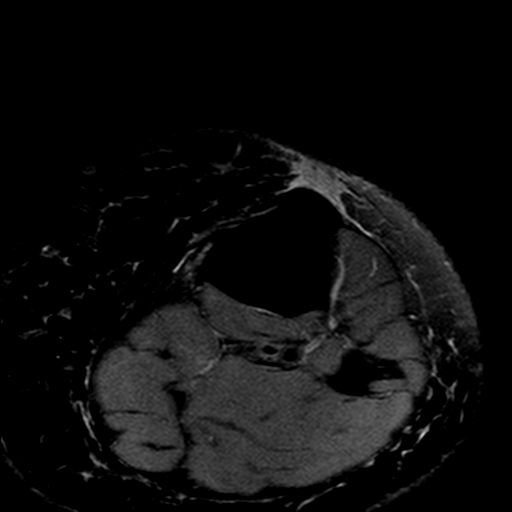
[im 11/36]
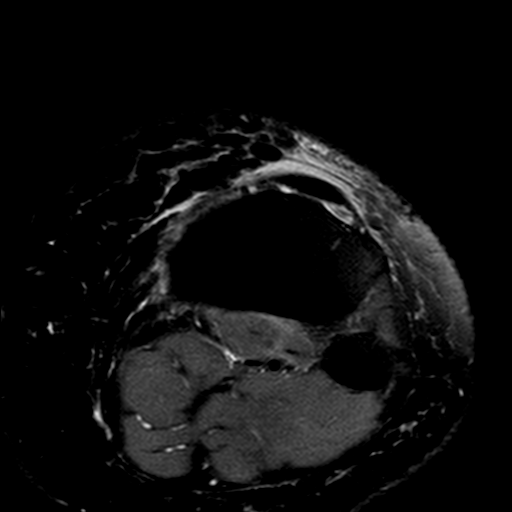
[im 16/36]
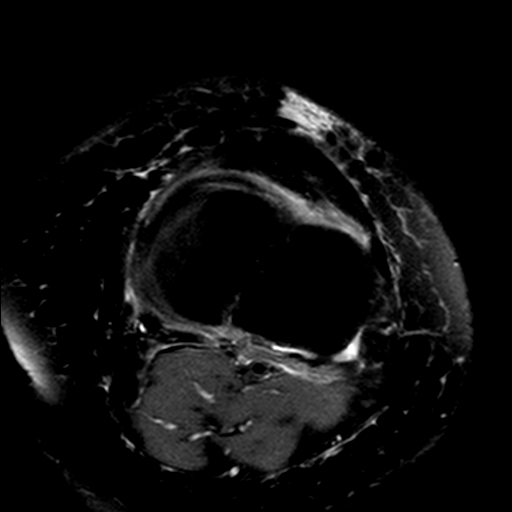
[im 21/36]
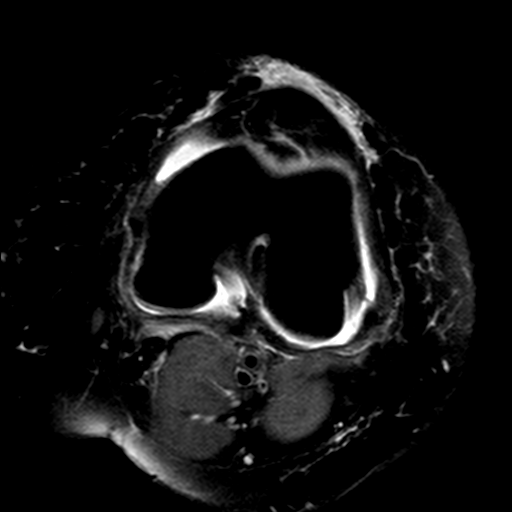
[im 26/36]
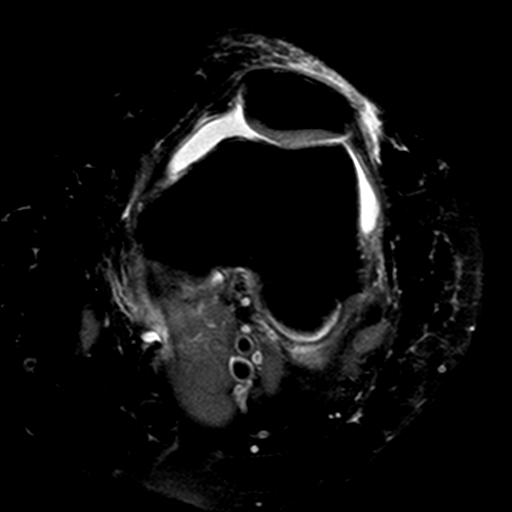
[im 31/36]
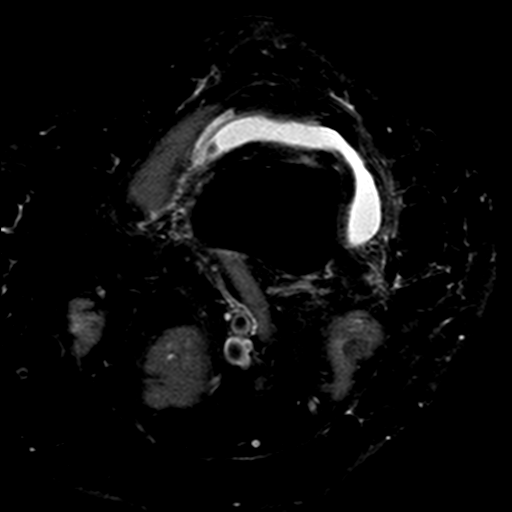
[im 36/36]
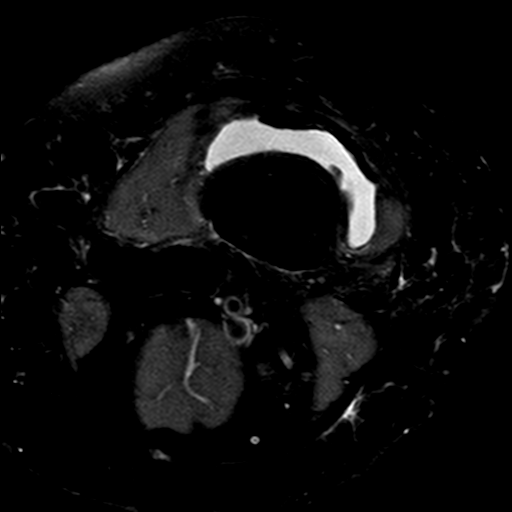

[Series 4: T2 fat-sat · coronal · 3.0mm · 0.62mm/px · 8 of 31 slices shown]
[im 1/31]
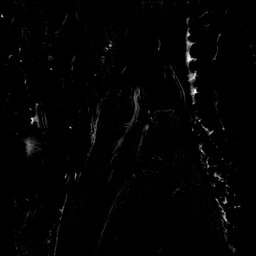
[im 5/31]
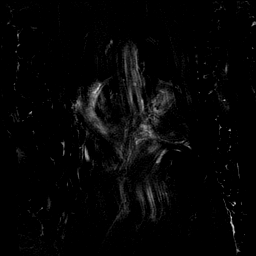
[im 9/31]
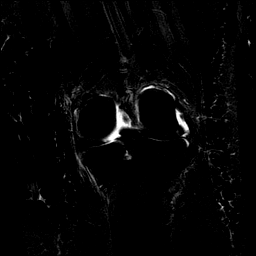
[im 13/31]
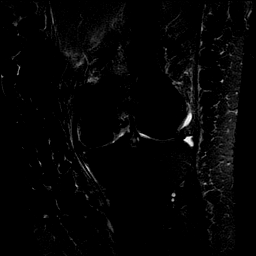
[im 18/31]
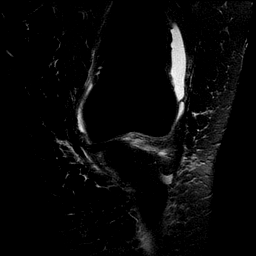
[im 22/31]
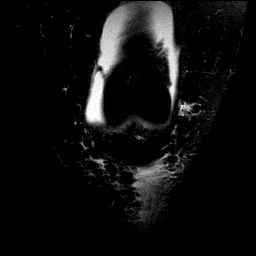
[im 26/31]
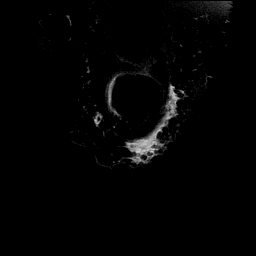
[im 31/31]
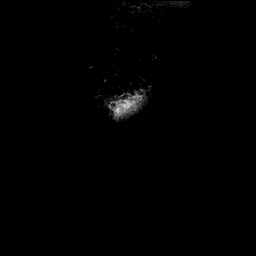

[Series 5: PD fat-sat · coronal · 3.0mm · 0.62mm/px · 8 of 31 slices shown (2 of 3)]
[im 1/31]
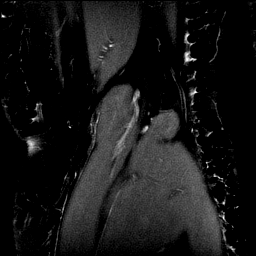
[im 5/31]
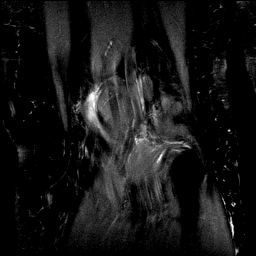
[im 9/31]
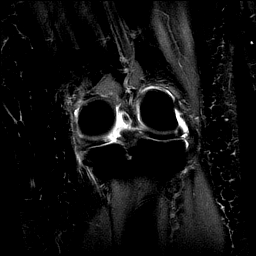
[im 13/31]
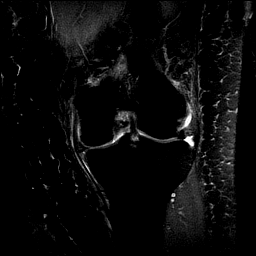
[im 18/31]
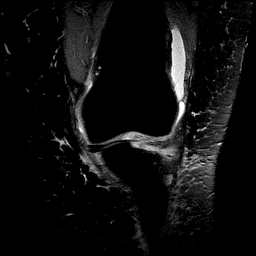
[im 22/31]
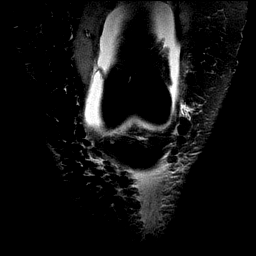
[im 26/31]
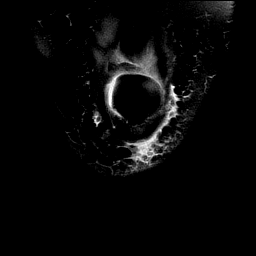
[im 31/31]
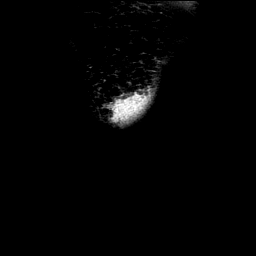

[Series 6: T1 · coronal · 3.0mm · 0.62mm/px · 8 of 31 slices shown]
[im 1/31]
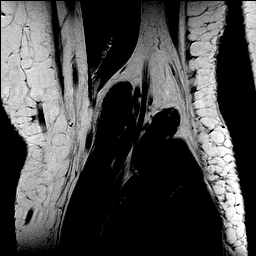
[im 5/31]
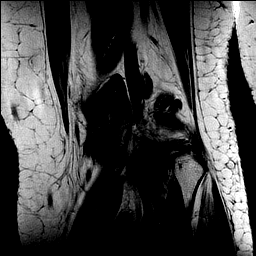
[im 9/31]
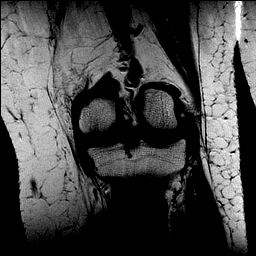
[im 13/31]
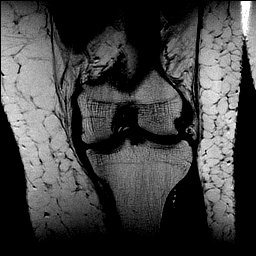
[im 18/31]
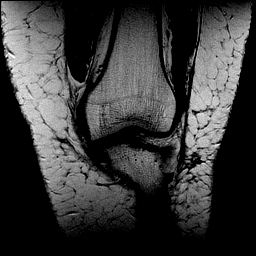
[im 22/31]
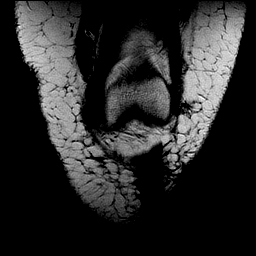
[im 26/31]
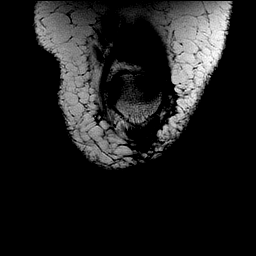
[im 31/31]
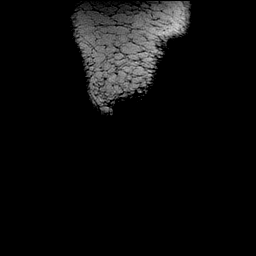

[Series 7: PD fat-sat · sagittal · 3.0mm · 0.62mm/px · 8 of 33 slices shown (3 of 3)]
[im 1/33]
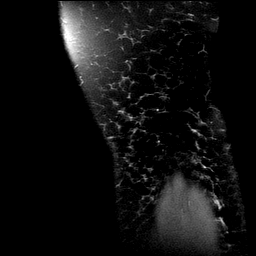
[im 5/33]
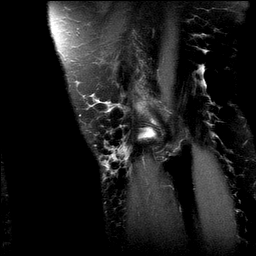
[im 10/33]
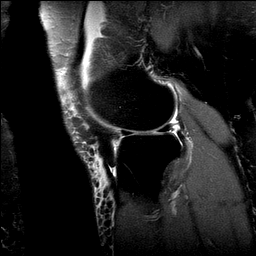
[im 14/33]
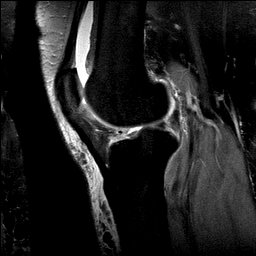
[im 19/33]
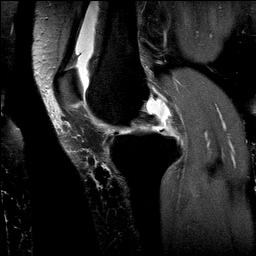
[im 23/33]
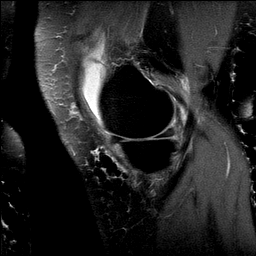
[im 28/33]
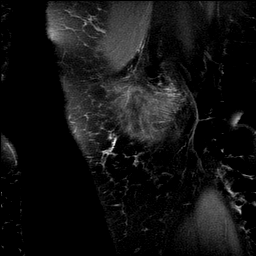
[im 33/33]
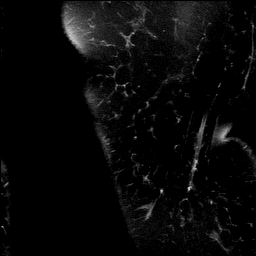

[40 of 40 positions shown; findings below may reference images not displayed]

FINDINGS: MENISCI

Medial meniscus: The patient has a complete radial tear just
peripheral to the root of the posterior horn of the medial meniscus.

Lateral meniscus: Fraying is seen along the free edge of the body.
No tear.

LIGAMENTS

Cruciates:  Intact.

Collaterals:  Intact.

CARTILAGE

Patellofemoral:  Unremarkable.

Medial:  Unremarkable.

Lateral:  Unremarkable.

Joint:  Moderate joint effusion.

Popliteal Fossa:  No Baker's cyst.

Extensor Mechanism:  Intact.

Bones:  No fracture or worrisome marrow lesion.
IMPRESSION: Complete radial tear just peripheral to the root of the posterior
horn of the medial meniscus.

Fraying along the free edge of the body of the lateral meniscus
without tear.

## 2018-10-03 DIAGNOSIS — L590 Erythema ab igne [dermatitis ab igne]: Secondary | ICD-10-CM | POA: Diagnosis not present

## 2019-01-01 DIAGNOSIS — H6983 Other specified disorders of Eustachian tube, bilateral: Secondary | ICD-10-CM | POA: Diagnosis not present

## 2019-01-01 DIAGNOSIS — J3489 Other specified disorders of nose and nasal sinuses: Secondary | ICD-10-CM | POA: Diagnosis not present

## 2019-01-27 DIAGNOSIS — H6121 Impacted cerumen, right ear: Secondary | ICD-10-CM | POA: Diagnosis not present

## 2019-01-27 DIAGNOSIS — R509 Fever, unspecified: Secondary | ICD-10-CM | POA: Diagnosis not present

## 2019-01-27 DIAGNOSIS — J069 Acute upper respiratory infection, unspecified: Secondary | ICD-10-CM | POA: Diagnosis not present

## 2019-10-09 ENCOUNTER — Other Ambulatory Visit (HOSPITAL_COMMUNITY)
Admission: RE | Admit: 2019-10-09 | Discharge: 2019-10-09 | Disposition: A | Discharge status: Home / Self Care | Payer: BC Managed Care – PPO | Source: Ambulatory Visit | Attending: Obstetrics and Gynecology | Admitting: Obstetrics and Gynecology

## 2019-10-09 ENCOUNTER — Ambulatory Visit (INDEPENDENT_AMBULATORY_CARE_PROVIDER_SITE_OTHER): Payer: BC Managed Care – PPO | Admitting: Obstetrics and Gynecology

## 2019-10-09 ENCOUNTER — Encounter: Payer: Self-pay | Admitting: Obstetrics and Gynecology

## 2019-10-09 ENCOUNTER — Other Ambulatory Visit: Payer: Self-pay

## 2019-10-09 VITALS — BP 152/84 | HR 80 | Ht 60.0 in | Wt 183.4 lb

## 2019-10-09 DIAGNOSIS — Z01419 Encounter for gynecological examination (general) (routine) without abnormal findings: Secondary | ICD-10-CM

## 2019-10-09 DIAGNOSIS — Z124 Encounter for screening for malignant neoplasm of cervix: Secondary | ICD-10-CM | POA: Insufficient documentation

## 2019-10-09 DIAGNOSIS — Z13220 Encounter for screening for lipoid disorders: Secondary | ICD-10-CM | POA: Diagnosis not present

## 2019-10-09 DIAGNOSIS — Z23 Encounter for immunization: Secondary | ICD-10-CM | POA: Diagnosis not present

## 2019-10-09 DIAGNOSIS — Z1231 Encounter for screening mammogram for malignant neoplasm of breast: Secondary | ICD-10-CM

## 2019-10-09 NOTE — Progress Notes (Signed)
Patient comes in today for well woman exam. She has no concerns today. She is due for labs, mammogram, and pap.

## 2019-10-09 NOTE — Progress Notes (Signed)
HPI:      [name] is a 63 y.o. No obstetric history on file. who LMP was No LMP recorded. Patient is postmenopausal.  Subjective:   She presents today for her annual examination.  She has no complaints.  It has been several years since she had her last examination. She reports that she is in menopause occasionally has difficulty sleeping with night sweats but generally does well.  She denies any vaginal bleeding. She has a history of a spot on her right breast with an implant BB placed for diagnostic purposes.   She states that her Paps have always been normal but she has not had one for several years Of significant note patient has a history of cervical cerclage x2.  These were both successful.    Hx: The following portions of the patient's history were reviewed and updated as appropriate:             She  has a past medical history of Diverticulosis. She does not have a problem list on file. She  has a past surgical history that includes arm surgery; Cesarean section; Ganglion cyst excision (Right); Breast biopsy; Trigger finger release (Right); Colonoscopy; Tonsillectomy; and Knee arthroscopy with medial menisectomy (Left, 04/22/2016). Her family history is not on file. She  reports that she has never smoked. She has never used smokeless tobacco. She reports that she does not drink alcohol or use drugs. She has a current medication list which includes the following prescription(s): calcium carbonate, cetirizine, vitamin d3, multivitamin, omega-3 fatty acids, hydrochlorothiazide, and hydrocodone-acetaminophen. She has No Known Allergies.       Review of Systems:  Review of Systems  Constitutional: Denied constitutional symptoms, night sweats, recent illness, fatigue, fever, insomnia and weight loss.  Eyes: Denied eye symptoms, eye pain, photophobia, vision change and visual disturbance.  Ears/Nose/Throat/Neck: Denied ear, nose, throat or neck symptoms, hearing loss, nasal  discharge, sinus congestion and sore throat.  Cardiovascular: Denied cardiovascular symptoms, arrhythmia, chest pain/pressure, edema, exercise intolerance, orthopnea and palpitations.  Respiratory: Denied pulmonary symptoms, asthma, pleuritic pain, productive sputum, cough, dyspnea and wheezing.  Gastrointestinal: Denied, gastro-esophageal reflux, melena, nausea and vomiting.  Genitourinary: Denied genitourinary symptoms including symptomatic vaginal discharge, pelvic relaxation issues, and urinary complaints.  Musculoskeletal: Denied musculoskeletal symptoms, stiffness, swelling, muscle weakness and myalgia.  Dermatologic: Denied dermatology symptoms, rash and scar.  Neurologic: Denied neurology symptoms, dizziness, headache, neck pain and syncope.  Psychiatric: Denied psychiatric symptoms, anxiety and depression.  Endocrine: Denied endocrine symptoms including hot flashes and night sweats.   Meds:   Current Outpatient Medications on File Prior to Visit  Medication Sig Dispense Refill  . calcium carbonate (OS-CAL - DOSED IN MG OF ELEMENTAL CALCIUM) 1250 (500 Ca) MG tablet Take 1 tablet by mouth daily.    . cetirizine (ZYRTEC) 10 MG tablet Take 10 mg by mouth daily.    . Cholecalciferol (VITAMIN D3) 5000 units TABS Take 1 tablet by mouth daily.    . Multiple Vitamin (MULTIVITAMIN) tablet Take 1 tablet by mouth daily.    . Omega-3 Fatty Acids (FISH OIL PO) Take 1 tablet by mouth daily.    . hydrochlorothiazide (HYDRODIURIL) 25 MG tablet Take 25 mg by mouth daily.    Marland Kitchen HYDROcodone-acetaminophen (NORCO) 5-325 MG tablet Take 1 tablet by mouth every 6 (six) hours as needed for moderate pain. (Patient not taking: Reported on 10/09/2019) 30 tablet 0   No current facility-administered medications on file prior to visit.  Objective:     Vitals:   10/09/19 0932  BP: (!) 152/84  Pulse: 80              Physical examination General NAD, Conversant  HEENT Atraumatic; Op clear with mmm.   Normo-cephalic. Pupils reactive. Anicteric sclerae  Thyroid/Neck Smooth without nodularity or enlargement. Normal ROM.  Neck Supple.  Skin No rashes, lesions or ulceration. Normal palpated skin turgor. No nodularity.  Breasts: No masses or discharge.  Symmetric.  No axillary adenopathy.  Lungs: Clear to auscultation.No rales or wheezes. Normal Respiratory effort, no retractions.  Heart: NSR.  No murmurs or rubs appreciated. No periferal edema  Abdomen: Soft.  Non-tender.  No masses.  No HSM. No hernia  Extremities: Moves all appropriately.  Normal ROM for age. No lymphadenopathy.  Neuro: Oriented to PPT.  Normal mood. Normal affect.     Pelvic:   Vulva: Normal appearance.  No lesions.  Vagina: No lesions or abnormalities noted.  Support: Normal pelvic support.  Urethra No masses tenderness or scarring.  Meatus Normal size without lesions or prolapse.  Cervix:  Located anteriorly and relatively flush with the vaginal vault.  Stenotic cervical os  Anus: Normal exam.  No lesions.  Perineum: Normal exam.  No lesions.        Bimanual   Uterus: Normal size.  Non-tender.  Mobile.  AV.  Adnexae: No masses.  Non-tender to palpation.  Cul-de-sac: Negative for abnormality.      Assessment:    No obstetric history on file. There are no active problems to display for this patient.    1. Well woman exam with routine gynecological exam   2. Encounter for screening mammogram for malignant neoplasm of breast   3. Screening for cervical cancer        Plan:            1.  Basic Screening Recommendations The basic screening recommendations for asymptomatic women were discussed with the patient during her visit.  The age-appropriate recommendations were discussed with her and the rational for the tests reviewed.  When I am informed by the patient that another primary care physician has previously obtained the age-appropriate tests and they are up-to-date, only outstanding tests are ordered and  referrals given as necessary.  Abnormal results of tests will be discussed with her when all of her results are completed.  Routine preventative health maintenance measures emphasized: Exercise/Diet/Weight control, Tobacco Warnings, Alcohol/Substance use risks and Stress Management Pap smear performed-lab work ordered-mammogram ordered. Orders Orders Placed This Encounter  Procedures  . MM 3D SCREEN BREAST BILATERAL  . Hemoglobin A1c  . Lipid panel  . TSH    No orders of the defined types were placed in this encounter.       F/U  Return in about 1 year (around 10/08/2020) for Annual Physical.  Elonda Husky, M.D. 10/09/2019 10:23 AM

## 2019-10-10 LAB — LIPID PANEL
Chol/HDL Ratio: 3 ratio (ref 0.0–4.4)
Cholesterol, Total: 172 mg/dL (ref 100–199)
HDL: 57 mg/dL (ref >39)
LDL Chol Calc (NIH): 98 mg/dL (ref 0–99)
Triglycerides: 91 mg/dL (ref 0–149)
VLDL Cholesterol Cal: 17 mg/dL (ref 5–40)

## 2019-10-10 LAB — HEMOGLOBIN A1C
Est. average glucose Bld gHb Est-mCnc: 100 mg/dL
Hgb A1c MFr Bld: 5.1 % (ref 4.8–5.6)

## 2019-10-10 LAB — TSH: TSH: 0.972 u[IU]/mL (ref 0.450–4.500)

## 2019-10-17 LAB — CYTOLOGY - PAP
Comment: NEGATIVE
Comment: NEGATIVE
Diagnosis: NEGATIVE
HPV 16: POSITIVE — AB
HPV 18 / 45: NEGATIVE
High risk HPV: POSITIVE — AB

## 2019-11-01 ENCOUNTER — Other Ambulatory Visit: Payer: Self-pay

## 2019-11-01 ENCOUNTER — Other Ambulatory Visit (HOSPITAL_COMMUNITY)
Admission: RE | Admit: 2019-11-01 | Discharge: 2019-11-01 | Disposition: A | Discharge status: Home / Self Care | Payer: BC Managed Care – PPO | Source: Ambulatory Visit | Attending: Obstetrics and Gynecology | Admitting: Obstetrics and Gynecology

## 2019-11-01 ENCOUNTER — Encounter: Payer: Self-pay | Admitting: Obstetrics and Gynecology

## 2019-11-01 ENCOUNTER — Ambulatory Visit (INDEPENDENT_AMBULATORY_CARE_PROVIDER_SITE_OTHER): Payer: BC Managed Care – PPO | Admitting: Obstetrics and Gynecology

## 2019-11-01 VITALS — BP 180/100 | HR 69 | Ht 60.0 in | Wt 184.0 lb

## 2019-11-01 DIAGNOSIS — N72 Inflammatory disease of cervix uteri: Secondary | ICD-10-CM | POA: Insufficient documentation

## 2019-11-01 DIAGNOSIS — B977 Papillomavirus as the cause of diseases classified elsewhere: Secondary | ICD-10-CM

## 2019-11-01 DIAGNOSIS — I10 Essential (primary) hypertension: Secondary | ICD-10-CM | POA: Diagnosis not present

## 2019-11-01 NOTE — Progress Notes (Signed)
Patient comes in today for abnormal Pap.

## 2019-11-01 NOTE — Addendum Note (Signed)
Addended by: Durwin Glaze on: 11/01/2019 10:31 AM   Modules accepted: Orders

## 2019-11-01 NOTE — Progress Notes (Signed)
HPI:  Chelsea [name] is a 63 y.o.  No obstetric history on file.  who presents today for evaluation and management of positive HPV 16/18-normal cytology  Dysplasia History: Patient states that she has had dysplasia in the past but has never been treated for it.   Of significant note patient has had 2 prior cerclage is during pregnancy. ROS:  Pertinent items are noted in HPI.  OB History  No obstetric history on file.    Past Medical History:  Diagnosis Date  . Diverticulosis     Past Surgical History:  Procedure Laterality Date  . arm surgery    . BREAST BIOPSY    . CESAREAN SECTION    . COLONOSCOPY    . GANGLION CYST EXCISION Right   . KNEE ARTHROSCOPY WITH MEDIAL MENISECTOMY Left 04/22/2016   Procedure: KNEE ARTHROSCOPY WITH MEDIAL MENISECTOMY;  Surgeon: Kennedy Bucker, MD;  Location: [facility] ORS;  Service: Orthopedics;  Laterality: Left;  . TONSILLECTOMY    . TRIGGER FINGER RELEASE Right     SOCIAL HISTORY: Social History   Substance and Sexual Activity  Alcohol Use No   Social History   Substance and Sexual Activity  Drug Use No     History reviewed. No pertinent family history.  ALLERGIES:  Patient has no known allergies.  She has a current medication list which includes the following prescription(s): calcium carbonate, cetirizine, vitamin d3, hydrochlorothiazide, hydrocodone-acetaminophen, multivitamin, and omega-3 fatty acids.  Physical Exam: -Vitals:  BP (!) 180/100   Pulse 69   Ht 5' (1.524 m)   Wt 184 lb (83.5 kg)   BMI 35.94 kg/m  GEN: WD, WN, NAD.  A+ O x 3, good mood and affect. ABD:  NT, ND.  Soft, no masses.  No hernias noted.  PROCEDURE: 1.  Urine Pregnancy Test:  not done 2.  Colposcopy performed with 4% acetic acid after verbal consent obtained                           -Aceto-white Lesions Location(s): 6 o'clock.              -Biopsy performed at 6 o'clock               -ECC indicated and performed: No.     -Biopsy sites made  hemostatic with pressure and Monsel's solution   -Satisfactory colposcopy: Yes.      -Evidence of Invasive cervical CA :  NO  ASSESSMENT:  Chelsea [name] is a 63 y.o. No obstetric history on file. here for  1. High risk human papilloma virus (HPV) infection of cervix   .  PLAN: 1.  I discussed the grading system of pap smears and HPV high risk viral types.  We will discuss management after colpo results return. 2.  Strongly advised patient to see her family physician or internist for hypertension.  We have discussed hypertension in detail and the necessity of timely appointment discussed. 3.  Natural course and history of HPV discussed in detail.  Type 16/18 specifically discussed.  Cytology versus HPV discussed all questions answered.  No orders of the defined types were placed in this encounter.          F/U  Return in about 2 weeks (around 11/15/2019) for Colpo f/u. I spent 14 minutes involved in the care of this patient of which greater than 50% was spent discussing hypertension, HPV, cytology-abnormal Pap smears in general.  All  questions answered.  Jeannie Fend ,MD 11/01/2019,9:42 AM

## 2019-11-02 LAB — SURGICAL PATHOLOGY

## 2019-11-04 ENCOUNTER — Ambulatory Visit
Admission: EM | Admit: 2019-11-04 | Discharge: 2019-11-04 | Disposition: A | Discharge status: Home / Self Care | Payer: BC Managed Care – PPO | Attending: Family Medicine | Admitting: Family Medicine

## 2019-11-04 ENCOUNTER — Other Ambulatory Visit: Payer: Self-pay

## 2019-11-04 ENCOUNTER — Encounter: Payer: Self-pay | Admitting: Emergency Medicine

## 2019-11-04 DIAGNOSIS — I160 Hypertensive urgency: Secondary | ICD-10-CM | POA: Diagnosis not present

## 2019-11-04 LAB — COMPREHENSIVE METABOLIC PANEL WITH GFR
ALT: 16 U/L (ref 0–44)
AST: 21 U/L (ref 15–41)
Albumin: 4.2 g/dL (ref 3.5–5.0)
Alkaline Phosphatase: 100 U/L (ref 38–126)
Anion gap: 7 (ref 5–15)
BUN: 19 mg/dL (ref 8–23)
CO2: 26 mmol/L (ref 22–32)
Calcium: 9 mg/dL (ref 8.9–10.3)
Chloride: 105 mmol/L (ref 98–111)
Creatinine, Ser: 0.77 mg/dL (ref 0.44–1.00)
GFR calc Af Amer: >60 mL/min
GFR calc non Af Amer: >60 mL/min
Glucose, Bld: 95 mg/dL (ref 70–99)
Potassium: 3.9 mmol/L (ref 3.5–5.1)
Sodium: 138 mmol/L (ref 135–145)
Total Bilirubin: 0.3 mg/dL (ref 0.3–1.2)
Total Protein: 7.7 g/dL (ref 6.5–8.1)

## 2019-11-04 LAB — CBC WITH DIFFERENTIAL/PLATELET
Abs Immature Granulocytes: 0.02 K/uL (ref 0.00–0.07)
Basophils Absolute: 0.1 K/uL (ref 0.0–0.1)
Basophils Relative: 1 %
Eosinophils Absolute: 0.1 K/uL (ref 0.0–0.5)
Eosinophils Relative: 2 %
HCT: 39.9 % (ref 36.0–46.0)
Hemoglobin: 12.4 g/dL (ref 12.0–15.0)
Immature Granulocytes: 0 %
Lymphocytes Relative: 29 %
Lymphs Abs: 1.7 K/uL (ref 0.7–4.0)
MCH: 25.8 pg — ABNORMAL LOW (ref 26.0–34.0)
MCHC: 31.1 g/dL (ref 30.0–36.0)
MCV: 83.1 fL (ref 80.0–100.0)
Monocytes Absolute: 0.4 K/uL (ref 0.1–1.0)
Monocytes Relative: 7 %
Neutro Abs: 3.6 K/uL (ref 1.7–7.7)
Neutrophils Relative %: 61 %
Platelets: 261 K/uL (ref 150–400)
RBC: 4.8 MIL/uL (ref 3.87–5.11)
RDW: 13.3 % (ref 11.5–15.5)
WBC: 5.8 K/uL (ref 4.0–10.5)
nRBC: 0 % (ref 0.0–0.2)

## 2019-11-04 MED ORDER — AMLODIPINE BESYLATE 5 MG PO TABS: 5.0000 mg | ORAL_TABLET | Freq: Every day | ORAL | 1 refills | Status: DC

## 2019-11-04 NOTE — ED Triage Notes (Signed)
Patient in today c/o elevated blood pressure since Thursday (11/01/19). Patient was seen at her GYN and her BP was 200/102 and 180/100. Patient is trying to get established with a PCP, but does not have one at this time.

## 2019-11-04 NOTE — Discharge Instructions (Signed)
Medication as prescribed.  Please establish with a PCP.  Check BP daily.  Take care  Dr. Lacinda Axon

## 2019-11-04 NOTE — ED Provider Notes (Signed)
[facility]    CSN: [id number] Arrival date & time: 11/04/19  0854      History   Chief Complaint Chief Complaint  Patient presents with  . Hypertension    APPT    HPI   63 year old female presents with hypertension.  Patient recently has had elevated blood pressure and has been checking her blood pressures at home.  Blood pressure markedly elevated at home and markedly elevated here today.  Patient states that she feels otherwise well.  She appears to have a history of hypertension that is currently untreated.  She is in the process of establishing with a primary care physician.  Denies chest pain.  Denies shortness of breath.  States that she has headaches occasionally.  No vision changes.  Denies any other symptoms.  No other complaints or concerns at this time.  PMH, Surgical Hx, Family Hx, Social History reviewed and updated as below.  PMH: Asthma, colon polyp, hypertension, high risk HPV  Past Surgical History:  Procedure Laterality Date  . arm surgery    . BREAST BIOPSY    . CESAREAN SECTION    . COLONOSCOPY    . GANGLION CYST EXCISION Right   . KNEE ARTHROSCOPY WITH MEDIAL MENISECTOMY Left 04/22/2016   Procedure: KNEE ARTHROSCOPY WITH MEDIAL MENISECTOMY;  Surgeon: Kennedy Bucker, MD;  Location: [facility] ORS;  Service: Orthopedics;  Laterality: Left;  . TONSILLECTOMY    . TRIGGER FINGER RELEASE Right     OB History   No obstetric history on file.      Home Medications    Prior to Admission medications   Medication Sig Start Date End Date Taking? Authorizing Provider  calcium carbonate (OS-CAL - DOSED IN MG OF ELEMENTAL CALCIUM) 1250 (500 Ca) MG tablet Take 1 tablet by mouth daily.   Yes [provider]  cetirizine (ZYRTEC) 10 MG tablet Take 10 mg by mouth daily.   Yes [provider]  Cholecalciferol (VITAMIN D3) 5000 units TABS Take 1 tablet by mouth daily.   Yes [provider]  Multiple Vitamin (MULTIVITAMIN) tablet Take  1 tablet by mouth daily.   Yes [provider]  Omega-3 Fatty Acids (FISH OIL PO) Take 1 tablet by mouth daily.   Yes [provider]  amLODipine (NORVASC) 5 MG tablet Take 1 tablet (5 mg total) by mouth daily. After 1 week may increase to 10 mg daily. 11/04/19   Tommie Sams, DO  hydrochlorothiazide (HYDRODIURIL) 25 MG tablet Take 25 mg by mouth daily.  11/04/19  [provider]    Family History Family History  Problem Relation Age of Onset  . Heart attack Mother 51  . Kidney disease Father   . Diabetes Father     Social History Social History   Tobacco Use  . Smoking status: Never Smoker  . Smokeless tobacco: Never Used  Substance Use Topics  . Alcohol use: No  . Drug use: No     Allergies   Patient has no known allergies.   Review of Systems Review of Systems  Constitutional: Negative.   Eyes: Negative.   Respiratory: Negative.   Cardiovascular: Negative.    Physical Exam Triage Vital Signs ED Triage Vitals  Enc Vitals Group     BP 11/04/19 0907 (!) 216/103     Pulse Rate 11/04/19 0907 80     Resp 11/04/19 0907 18     Temp 11/04/19 0907 98.4 F (36.9 C)     Temp Source 11/04/19 [phone]  Oral     SpO2 11/04/19 0907 100 %     Weight 11/04/19 0906 183 lb (83 kg)     Height 11/04/19 0906 5' (1.524 m)     Head Circumference --      Peak Flow --      Pain Score 11/04/19 0906 0     Pain Loc --      Pain Edu? --      Excl. in GC? --    Updated Vital Signs BP (!) 204/94 (BP Location: Left Arm)   Pulse 80   Temp 98.4 F (36.9 C) (Oral)   Resp 18   Ht 5' (1.524 m)   Wt 83 kg   SpO2 100%   BMI 35.74 kg/m   Visual Acuity Right Eye Distance:   Left Eye Distance:   Bilateral Distance:    Right Eye Near:   Left Eye Near:    Bilateral Near:     Physical Exam Vitals signs and nursing note reviewed.  Constitutional:      General: She is not in acute distress.    Appearance: Normal appearance. She is obese. She is not  ill-appearing.  HENT:     Head: Normocephalic and atraumatic.     Mouth/Throat:     Pharynx: Oropharynx is clear. No posterior oropharyngeal erythema.  Eyes:     General:        Right eye: No discharge.        Left eye: No discharge.     Conjunctiva/sclera: Conjunctivae normal.  Cardiovascular:     Rate and Rhythm: Normal rate and regular rhythm.     Heart sounds: No murmur.  Pulmonary:     Effort: Pulmonary effort is normal.     Breath sounds: Normal breath sounds. No wheezing, rhonchi or rales.  Neurological:     Mental Status: She is alert.  Psychiatric:        Mood and Affect: Mood normal.        Behavior: Behavior normal.    UC Treatments / Results  Labs (all labs ordered are listed, but only abnormal results are displayed) Labs Reviewed  CBC WITH DIFFERENTIAL/PLATELET - Abnormal; Notable for the following components:      Result Value   MCH 25.8 (*)    All other components within normal limits  COMPREHENSIVE METABOLIC PANEL    EKG   Radiology No results found.  Procedures Procedures (including critical care time)  Medications Ordered in UC Medications - No data to display  Initial Impression / Assessment and Plan / UC Course  I have reviewed the triage vital signs and the nursing notes.  Pertinent labs & imaging results that were available during my care of the patient were reviewed by me and considered in my medical decision making (see chart for details).    63 year old female presents with hypertensive urgency.  Laboratory studies unremarkable today.  Placing on Norvasc.  Advised to follow-up with primary care.  Final Clinical Impressions(s) / UC Diagnoses   Final diagnoses:  Hypertensive urgency     Discharge Instructions     Medication as prescribed.  Please establish with a PCP.  Check BP daily.  Take care  Dr. Adriana Simasook     ED Prescriptions    Medication Sig Dispense Auth. Provider   amLODipine (NORVASC) 5 MG tablet Take 1 tablet (5  mg total) by mouth daily. After 1 week may increase to 10 mg daily. 90 tablet [place], Loretto[place]ce G, [place]  PDMP not reviewed this encounter.   Coral Spikes, DO 11/04/19 1013

## 2019-11-13 ENCOUNTER — Other Ambulatory Visit: Payer: Self-pay

## 2019-11-13 ENCOUNTER — Ambulatory Visit (INDEPENDENT_AMBULATORY_CARE_PROVIDER_SITE_OTHER): Payer: BC Managed Care – PPO | Admitting: Obstetrics and Gynecology

## 2019-11-13 ENCOUNTER — Encounter: Payer: Self-pay | Admitting: Obstetrics and Gynecology

## 2019-11-13 VITALS — BP 186/78 | HR 84 | Wt 183.6 lb

## 2019-11-13 DIAGNOSIS — B977 Papillomavirus as the cause of diseases classified elsewhere: Secondary | ICD-10-CM

## 2019-11-13 DIAGNOSIS — N72 Inflammatory disease of cervix uteri: Secondary | ICD-10-CM | POA: Diagnosis not present

## 2019-11-13 DIAGNOSIS — N870 Mild cervical dysplasia: Secondary | ICD-10-CM | POA: Diagnosis not present

## 2019-11-13 NOTE — Progress Notes (Signed)
HPI:      [name] is a 63 y.o. No obstetric history on file. who LMP was No LMP recorded. Patient is postmenopausal.  Subjective:   She presents today to discuss her colposcopy results revealing CIN-1.  Patient is known to have high risk viral types 16/18.  Her last Pap smear showed normal cytology.    Hx: The following portions of the patient's history were reviewed and updated as appropriate:             She  has a past medical history of Diverticulosis. She does not have a problem list on file. She  has a past surgical history that includes arm surgery; Cesarean section; Ganglion cyst excision (Right); Breast biopsy; Trigger finger release (Right); Colonoscopy; Tonsillectomy; and Knee arthroscopy with medial menisectomy (Left, 04/22/2016). Her family history includes Diabetes in her father; Heart attack (age of onset: 66) in her mother; Kidney disease in her father. She  reports that she has never smoked. She has never used smokeless tobacco. She reports that she does not drink alcohol or use drugs. She has a current medication list which includes the following prescription(s): amlodipine, calcium carbonate, cetirizine, vitamin d3, multivitamin, omega-3 fatty acids, and hydrochlorothiazide. She has No Known Allergies.       Review of Systems:  Review of Systems  Constitutional: Denied constitutional symptoms, night sweats, recent illness, fatigue, fever, insomnia and weight loss.  Eyes: Denied eye symptoms, eye pain, photophobia, vision change and visual disturbance.  Ears/Nose/Throat/Neck: Denied ear, nose, throat or neck symptoms, hearing loss, nasal discharge, sinus congestion and sore throat.  Cardiovascular: Denied cardiovascular symptoms, arrhythmia, chest pain/pressure, edema, exercise intolerance, orthopnea and palpitations.  Respiratory: Denied pulmonary symptoms, asthma, pleuritic pain, productive sputum, cough, dyspnea and wheezing.  Gastrointestinal: Denied,  gastro-esophageal reflux, melena, nausea and vomiting.  Genitourinary: Denied genitourinary symptoms including symptomatic vaginal discharge, pelvic relaxation issues, and urinary complaints.  Musculoskeletal: Denied musculoskeletal symptoms, stiffness, swelling, muscle weakness and myalgia.  Dermatologic: Denied dermatology symptoms, rash and scar.  Neurologic: Denied neurology symptoms, dizziness, headache, neck pain and syncope.  Psychiatric: Denied psychiatric symptoms, anxiety and depression.  Endocrine: Denied endocrine symptoms including hot flashes and night sweats.   Meds:   Current Outpatient Medications on File Prior to Visit  Medication Sig Dispense Refill  . amLODipine (NORVASC) 5 MG tablet Take 1 tablet (5 mg total) by mouth daily. After 1 week may increase to 10 mg daily. 90 tablet 1  . calcium carbonate (OS-CAL - DOSED IN MG OF ELEMENTAL CALCIUM) 1250 (500 Ca) MG tablet Take 1 tablet by mouth daily.    . cetirizine (ZYRTEC) 10 MG tablet Take 10 mg by mouth daily.    . Cholecalciferol (VITAMIN D3) 5000 units TABS Take 1 tablet by mouth daily.    . Multiple Vitamin (MULTIVITAMIN) tablet Take 1 tablet by mouth daily.    . Omega-3 Fatty Acids (FISH OIL PO) Take 1 tablet by mouth daily.    . [DISCONTINUED] hydrochlorothiazide (HYDRODIURIL) 25 MG tablet Take 25 mg by mouth daily.     No current facility-administered medications on file prior to visit.     Objective:     Vitals:   11/13/19 1554  BP: (!) 186/78  Pulse: 84              Colposcopy results reviewed directly with the patient.  Assessment:    No obstetric history on file. There are no active problems to display for this patient.  1. CIN I (cervical intraepithelial neoplasia I)   2. High risk human papilloma virus (HPV) infection of cervix     Known type 16/18   Plan:            1.  Have discussed CIN-1 in detail with the patient.  High risk viral typing also discussed.  Because of her high risk  viral types and her CIN 1 I have recommended a follow-up colposcopy in 6 months.  Should that prove to also show CIN-1 would consider annual Pap smears as follow-up. Orders No orders of the defined types were placed in this encounter.   No orders of the defined types were placed in this encounter.     F/U  Return in about 6 months (around 05/13/2020). I spent 18 minutes involved in the care of this patient of which greater than 50% was spent discussing cervical cytology, HPV, colposcopy findings natural course and history of HPV and dysplasia possible future screenings and treatments.  All questions answered.  Finis Bud, M.D. 11/13/2019 6:35 PM

## 2019-11-13 NOTE — Progress Notes (Signed)
Patient comes in today for Colpo results.  

## 2019-12-28 ENCOUNTER — Encounter: Payer: Self-pay | Admitting: Internal Medicine

## 2019-12-28 ENCOUNTER — Ambulatory Visit: Payer: BC Managed Care – PPO | Admitting: Internal Medicine

## 2019-12-28 ENCOUNTER — Ambulatory Visit (INDEPENDENT_AMBULATORY_CARE_PROVIDER_SITE_OTHER): Payer: BC Managed Care – PPO | Admitting: Internal Medicine

## 2019-12-28 ENCOUNTER — Other Ambulatory Visit: Payer: Self-pay

## 2019-12-28 VITALS — BP 138/62 | HR 70 | Temp 98.5°F | Ht 60.0 in | Wt 183.0 lb

## 2019-12-28 DIAGNOSIS — I10 Essential (primary) hypertension: Secondary | ICD-10-CM | POA: Insufficient documentation

## 2019-12-28 DIAGNOSIS — N870 Mild cervical dysplasia: Secondary | ICD-10-CM | POA: Diagnosis not present

## 2019-12-28 DIAGNOSIS — Z1159 Encounter for screening for other viral diseases: Secondary | ICD-10-CM

## 2019-12-28 MED ORDER — AMLODIPINE BESYLATE 10 MG PO TABS: 10.0000 mg | ORAL_TABLET | Freq: Every day | ORAL | 1 refills | Status: DC

## 2019-12-28 NOTE — Progress Notes (Signed)
Date:  12/28/2019   Name:  Chelsea [name]   DOB:  03-08-1956   MRN:  [id number]   Chief Complaint: Establish Care and Hypertension (Takes amlodipine 10 mg. Needs RF. )  Immunization History  Administered Date(s) Administered  . Influenza,inj,Quad PF,6+ Mos 10/09/2019  . Influenza-Unspecified 09/09/2016, 09/28/2017, 09/26/2018    Hypertension This is a new problem. The current episode started more than 1 month ago. The problem is controlled (bp at home 130/70). Pertinent negatives include no chest pain, headaches, palpitations or shortness of breath. Past treatments include calcium channel blockers. The current treatment provides significant improvement. There are no compliance problems.     Lab Results  Component Value Date   CREATININE 0.77 11/04/2019   BUN 19 11/04/2019   NA 138 11/04/2019   K 3.9 11/04/2019   CL 105 11/04/2019   CO2 26 11/04/2019   Lab Results  Component Value Date   CHOL 172 10/09/2019   HDL 57 10/09/2019   LDLCALC 98 10/09/2019   TRIG 91 10/09/2019   CHOLHDL 3.0 10/09/2019   Lab Results  Component Value Date   TSH 0.972 10/09/2019   Lab Results  Component Value Date   HGBA1C 5.1 10/09/2019     Review of Systems  Constitutional: Negative for chills, fatigue and fever.  Respiratory: Negative for cough, chest tightness and shortness of breath.   Cardiovascular: Positive for leg swelling (minimal, unchanged). Negative for chest pain and palpitations.  Gastrointestinal: Positive for anal bleeding (intermittent from hemorrhoids). Negative for abdominal pain, constipation and diarrhea.  Neurological: Negative for dizziness, light-headedness and headaches.    Patient Active Problem List   Diagnosis Date Noted  . Essential hypertension 12/28/2019  . Dysplasia of cervix, low grade (CIN 1) 12/28/2019  . Carpal tunnel syndrome 05/30/2014    No Known Allergies  Past Surgical History:  Procedure Laterality Date  . arm surgery    . BREAST  BIOPSY    . CESAREAN SECTION    . COLONOSCOPY    . GANGLION CYST EXCISION Right   . KNEE ARTHROSCOPY WITH MEDIAL MENISECTOMY Left 04/22/2016   Procedure: KNEE ARTHROSCOPY WITH MEDIAL MENISECTOMY;  Surgeon: Kennedy Bucker, MD;  Location: [facility] ORS;  Service: Orthopedics;  Laterality: Left;  . TONSILLECTOMY    . TRIGGER FINGER RELEASE Right     Social History   Tobacco Use  . Smoking status: Never Smoker  . Smokeless tobacco: Never Used  Substance Use Topics  . Alcohol use: No  . Drug use: No     Medication list has been reviewed and updated.  Current Meds  Medication Sig  . amLODipine (NORVASC) 5 MG tablet Take 1 tablet (5 mg total) by mouth daily. After 1 week may increase to 10 mg daily. (Patient taking differently: Take 10 mg by mouth daily. )  . calcium carbonate (OS-CAL - DOSED IN MG OF ELEMENTAL CALCIUM) 1250 (500 Ca) MG tablet Take 1 tablet by mouth daily.  . cetirizine (ZYRTEC) 10 MG tablet Take 10 mg by mouth daily.  . Cholecalciferol (VITAMIN D3) 5000 units TABS Take 1 tablet by mouth daily.  . Multiple Vitamin (MULTIVITAMIN) tablet Take 1 tablet by mouth daily.  . Omega-3 Fatty Acids (FISH OIL PO) Take 1 tablet by mouth daily.    PHQ 2/9 Scores 12/28/2019  PHQ - 2 Score 0  PHQ- 9 Score 0    BP Readings from Last 3 Encounters:  12/28/19 138/62  11/13/19 (!) 186/78  11/04/19 (!) 204/94  Physical Exam Vitals and nursing note reviewed.  Constitutional:      General: She is not in acute distress.    Appearance: Normal appearance. She is well-developed.  HENT:     Head: Normocephalic and atraumatic.  Neck:     Vascular: No carotid bruit.  Cardiovascular:     Rate and Rhythm: Normal rate and regular rhythm.     Pulses: Normal pulses.     Heart sounds: No murmur.  Pulmonary:     Effort: Pulmonary effort is normal. No respiratory distress.     Breath sounds: No wheezing or rhonchi.  Musculoskeletal:     Cervical back: Normal range of motion.     Right lower  leg: 1+ Pitting Edema present.     Left lower leg: 1+ Pitting Edema present.  Lymphadenopathy:     Cervical: No cervical adenopathy.  Skin:    General: Skin is warm and dry.     Findings: No rash.  Neurological:     Mental Status: She is alert and oriented to person, place, and time.  Psychiatric:        Attention and Perception: Attention normal.        Mood and Affect: Mood normal.        Speech: Speech normal.        Behavior: Behavior normal.        Thought Content: Thought content normal.        Cognition and Memory: Cognition normal.     Wt Readings from Last 3 Encounters:  12/28/19 183 lb (83 kg)  11/13/19 183 lb 9.6 oz (83.3 kg)  11/04/19 183 lb (83 kg)    BP 138/62   Pulse 70   Temp 98.5 F (36.9 C) (Oral)   Ht 5' (1.524 m)   Wt 183 lb (83 kg)   SpO2 100%   BMI 35.74 kg/m   Assessment and Plan: 1. Essential hypertension Clinically stable exam with well controlled BP on amlodipine 10 mg. Tolerating medications without side effects at this time.  Mild edema is unchanged - pt will monitor for worsening Pt to continue current regimen and low sodium diet; benefits of regular exercise as able discussed. DASH diet given. - Basic metabolic panel - amLODipine (NORVASC) 10 MG tablet; Take 1 tablet (10 mg total) by mouth daily.  Dispense: 90 tablet; Refill: 1  2. Dysplasia of cervix, low grade (CIN 1) Followed by GYN Recent Pap 09/2019 was NILM but positive for HPV 16  3. Need for hepatitis C screening test Discussed and obtained today - Hepatitis C antibody     Partially dictated using Editor, commissioning. Any errors are unintentional.  Halina Maidens, MD [facility]  12/28/2019

## 2019-12-28 NOTE — Patient Instructions (Signed)
DASH Eating Plan DASH stands for "Dietary Approaches to Stop Hypertension." The DASH eating plan is a healthy eating plan that has been shown to reduce high blood pressure (hypertension). It may also reduce your risk for type 2 diabetes, heart disease, and stroke. The DASH eating plan may also help with weight loss. What are tips for following this plan?  General guidelines  Avoid eating more than 2,300 mg (milligrams) of salt (sodium) a day. If you have hypertension, you may need to reduce your sodium intake to 1,500 mg a day.  Limit alcohol intake to no more than 1 drink a day for nonpregnant women and 2 drinks a day for men. One drink equals 12 oz of beer, 5 oz of wine, or 1 oz of hard liquor.  Work with your health care provider to maintain a healthy body weight or to lose weight. Ask what an ideal weight is for you.  Get at least 30 minutes of exercise that causes your heart to beat faster (aerobic exercise) most days of the week. Activities may include walking, swimming, or biking.  Work with your health care provider or diet and nutrition specialist (dietitian) to adjust your eating plan to your individual calorie needs. Reading food labels   Check food labels for the amount of sodium per serving. Choose foods with less than 5 percent of the Daily Value of sodium. Generally, foods with less than 300 mg of sodium per serving fit into this eating plan.  To find whole grains, look for the word "whole" as the first word in the ingredient list. Shopping  Buy products labeled as "low-sodium" or "no salt added."  Buy fresh foods. Avoid canned foods and premade or frozen meals. Cooking  Avoid adding salt when cooking. Use salt-free seasonings or herbs instead of table salt or sea salt. Check with your health care provider or pharmacist before using salt substitutes.  Do not fry foods. Cook foods using healthy methods such as baking, boiling, grilling, and broiling instead.  Cook with  heart-healthy oils, such as olive, canola, soybean, or sunflower oil. Meal planning  Eat a balanced diet that includes: ? 5 or more servings of fruits and vegetables each day. At each meal, try to fill half of your plate with fruits and vegetables. ? Up to 6-8 servings of whole grains each day. ? Less than 6 oz of lean meat, poultry, or fish each day. A 3-oz serving of meat is about the same size as a deck of cards. One egg equals 1 oz. ? 2 servings of low-fat dairy each day. ? A serving of nuts, seeds, or beans 5 times each week. ? Heart-healthy fats. Healthy fats called Omega-3 fatty acids are found in foods such as flaxseeds and coldwater fish, like sardines, salmon, and mackerel.  Limit how much you eat of the following: ? Canned or prepackaged foods. ? Food that is high in trans fat, such as fried foods. ? Food that is high in saturated fat, such as fatty meat. ? Sweets, desserts, sugary drinks, and other foods with added sugar. ? Full-fat dairy products.  Do not salt foods before eating.  Try to eat at least 2 vegetarian meals each week.  Eat more home-cooked food and less restaurant, buffet, and fast food.  When eating at a restaurant, ask that your food be prepared with less salt or no salt, if possible. What foods are recommended? The items listed may not be a complete list. Talk with your dietitian about   what dietary choices are best for you. Grains Whole-grain or whole-wheat bread. Whole-grain or whole-wheat pasta. Brown rice. Oatmeal. Quinoa. Bulgur. Whole-grain and low-sodium cereals. Pita bread. Low-fat, low-sodium crackers. Whole-wheat flour tortillas. Vegetables Fresh or frozen vegetables (raw, steamed, roasted, or grilled). Low-sodium or reduced-sodium tomato and vegetable juice. Low-sodium or reduced-sodium tomato sauce and tomato paste. Low-sodium or reduced-sodium canned vegetables. Fruits All fresh, dried, or frozen fruit. Canned fruit in natural juice (without  added sugar). Meat and other protein foods Skinless chicken or turkey. Ground chicken or turkey. Pork with fat trimmed off. Fish and seafood. Egg whites. Dried beans, peas, or lentils. Unsalted nuts, nut butters, and seeds. Unsalted canned beans. Lean cuts of beef with fat trimmed off. Low-sodium, lean deli meat. Dairy Low-fat (1%) or fat-free (skim) milk. Fat-free, low-fat, or reduced-fat cheeses. Nonfat, low-sodium ricotta or cottage cheese. Low-fat or nonfat yogurt. Low-fat, low-sodium cheese. Fats and oils Soft margarine without trans fats. Vegetable oil. Low-fat, reduced-fat, or light mayonnaise and salad dressings (reduced-sodium). Canola, safflower, olive, soybean, and sunflower oils. Avocado. Seasoning and other foods Herbs. Spices. Seasoning mixes without salt. Unsalted popcorn and pretzels. Fat-free sweets. What foods are not recommended? The items listed may not be a complete list. Talk with your dietitian about what dietary choices are best for you. Grains Baked goods made with fat, such as croissants, muffins, or some breads. Dry pasta or rice meal packs. Vegetables Creamed or fried vegetables. Vegetables in a cheese sauce. Regular canned vegetables (not low-sodium or reduced-sodium). Regular canned tomato sauce and paste (not low-sodium or reduced-sodium). Regular tomato and vegetable juice (not low-sodium or reduced-sodium). Pickles. Olives. Fruits Canned fruit in a light or heavy syrup. Fried fruit. Fruit in cream or butter sauce. Meat and other protein foods Fatty cuts of meat. Ribs. Fried meat. Bacon. Sausage. Bologna and other processed lunch meats. Salami. Fatback. Hotdogs. Bratwurst. Salted nuts and seeds. Canned beans with added salt. Canned or smoked fish. Whole eggs or egg yolks. Chicken or turkey with skin. Dairy Whole or 2% milk, cream, and half-and-half. Whole or full-fat cream cheese. Whole-fat or sweetened yogurt. Full-fat cheese. Nondairy creamers. Whipped toppings.  Processed cheese and cheese spreads. Fats and oils Butter. Stick margarine. Lard. Shortening. Ghee. Bacon fat. Tropical oils, such as coconut, palm kernel, or palm oil. Seasoning and other foods Salted popcorn and pretzels. Onion salt, garlic salt, seasoned salt, table salt, and sea salt. Worcestershire sauce. Tartar sauce. Barbecue sauce. Teriyaki sauce. Soy sauce, including reduced-sodium. Steak sauce. Canned and packaged gravies. Fish sauce. Oyster sauce. Cocktail sauce. Horseradish that you find on the shelf. Ketchup. Mustard. Meat flavorings and tenderizers. Bouillon cubes. Hot sauce and Tabasco sauce. Premade or packaged marinades. Premade or packaged taco seasonings. Relishes. Regular salad dressings. Where to find more information:  National Heart, Lung, and Blood Institute: www.nhlbi.nih.gov  American Heart Association: www.heart.org Summary  The DASH eating plan is a healthy eating plan that has been shown to reduce high blood pressure (hypertension). It may also reduce your risk for type 2 diabetes, heart disease, and stroke.  With the DASH eating plan, you should limit salt (sodium) intake to 2,300 mg a day. If you have hypertension, you may need to reduce your sodium intake to 1,500 mg a day.  When on the DASH eating plan, aim to eat more fresh fruits and vegetables, whole grains, lean proteins, low-fat dairy, and heart-healthy fats.  Work with your health care provider or diet and nutrition specialist (dietitian) to adjust your eating plan to your   individual calorie needs. This information is not intended to replace advice given to you by your health care provider. Make sure you discuss any questions you have with your health care provider. Document Revised: 11/11/2017 Document Reviewed: 11/22/2016 Elsevier Patient Education  2020 [employer]  

## 2019-12-29 LAB — BASIC METABOLIC PANEL
BUN/Creatinine Ratio: 18 (ref 12–28)
BUN: 15 mg/dL (ref 8–27)
CO2: 24 mmol/L (ref 20–29)
Calcium: 9.3 mg/dL (ref 8.7–10.3)
Chloride: 106 mmol/L (ref 96–106)
Creatinine, Ser: 0.82 mg/dL (ref 0.57–1.00)
GFR calc Af Amer: 89 mL/min/{1.73_m2} (ref >59)
GFR calc non Af Amer: 77 mL/min/{1.73_m2} (ref >59)
Glucose: 87 mg/dL (ref 65–99)
Potassium: 4 mmol/L (ref 3.5–5.2)
Sodium: 142 mmol/L (ref 134–144)

## 2019-12-29 LAB — HEPATITIS C ANTIBODY: Hep C Virus Ab: <0.1 s/co ratio (ref 0.0–0.9)

## 2020-01-30 ENCOUNTER — Other Ambulatory Visit: Payer: Self-pay | Admitting: Family Medicine

## 2020-03-17 ENCOUNTER — Encounter: Payer: Self-pay | Admitting: Internal Medicine

## 2020-04-01 ENCOUNTER — Ambulatory Visit
Admission: RE | Admit: 2020-04-01 | Discharge: 2020-04-01 | Disposition: A | Discharge status: Home / Self Care | Payer: BC Managed Care – PPO | Source: Ambulatory Visit | Attending: Obstetrics and Gynecology | Admitting: Obstetrics and Gynecology

## 2020-04-01 DIAGNOSIS — Z1231 Encounter for screening mammogram for malignant neoplasm of breast: Secondary | ICD-10-CM | POA: Diagnosis present

## 2020-05-01 ENCOUNTER — Other Ambulatory Visit: Payer: Self-pay

## 2020-05-01 ENCOUNTER — Encounter: Payer: Self-pay | Admitting: Internal Medicine

## 2020-05-01 ENCOUNTER — Ambulatory Visit: Payer: BC Managed Care – PPO | Admitting: Internal Medicine

## 2020-05-01 VITALS — BP 138/64 | HR 74 | Temp 98.2°F | Ht 60.0 in | Wt 183.0 lb

## 2020-05-01 DIAGNOSIS — I10 Essential (primary) hypertension: Secondary | ICD-10-CM

## 2020-05-01 DIAGNOSIS — Z1211 Encounter for screening for malignant neoplasm of colon: Secondary | ICD-10-CM

## 2020-05-01 DIAGNOSIS — G4709 Other insomnia: Secondary | ICD-10-CM | POA: Insufficient documentation

## 2020-05-01 MED ORDER — ZALEPLON 10 MG PO CAPS: 10.0000 mg | ORAL_CAPSULE | Freq: Every evening | ORAL | 0 refills | Status: DC | PRN

## 2020-05-01 MED ORDER — AMLODIPINE BESYLATE 10 MG PO TABS: 10.0000 mg | ORAL_TABLET | Freq: Every day | ORAL | 1 refills | Status: DC

## 2020-05-01 NOTE — Progress Notes (Signed)
Date:  05/01/2020   Name:  Chelsea [name]   DOB:  31-Dec-1955   MRN:  [id number]   Chief Complaint: Hypertension (4 month follow up.)  Hypertension This is a chronic problem. The problem is controlled. Associated symptoms include anxiety and palpitations. Pertinent negatives include no chest pain, headaches or shortness of breath. There are no associated agents to hypertension. Past treatments include calcium channel blockers. The current treatment provides significant improvement. There are no compliance problems.  There is no history of CAD/MI, CVA or PVD.  Anxiety Presents for initial visit. Symptoms include insomnia, irritability, nervous/anxious behavior and palpitations. Patient reports no chest pain, depressed mood, dizziness, feeling of choking, shortness of breath or suicidal ideas. Episode frequency: several days per week. The severity of symptoms is moderate. The symptoms are aggravated by work stress and family issues.   Treatments tried: she usually leaves the situation and takes a walk. Compliance with prior treatments: she has not tried any medications.    Lab Results  Component Value Date   CREATININE 0.82 12/28/2019   BUN 15 12/28/2019   NA 142 12/28/2019   K 4.0 12/28/2019   CL 106 12/28/2019   CO2 24 12/28/2019   Lab Results  Component Value Date   CHOL 172 10/09/2019   HDL 57 10/09/2019   LDLCALC 98 10/09/2019   TRIG 91 10/09/2019   CHOLHDL 3.0 10/09/2019   Lab Results  Component Value Date   TSH 0.972 10/09/2019   Lab Results  Component Value Date   HGBA1C 5.1 10/09/2019   Lab Results  Component Value Date   WBC 5.8 11/04/2019   HGB 12.4 11/04/2019   HCT 39.9 11/04/2019   MCV 83.1 11/04/2019   PLT 261 11/04/2019   Lab Results  Component Value Date   ALT 16 11/04/2019   AST 21 11/04/2019   ALKPHOS 100 11/04/2019   BILITOT 0.3 11/04/2019     Review of Systems  Constitutional: Positive for irritability. Negative for chills, fatigue, fever  and unexpected weight change.  Respiratory: Negative for cough, chest tightness, shortness of breath and wheezing.   Cardiovascular: Positive for palpitations. Negative for chest pain and leg swelling.  Gastrointestinal: Negative for abdominal pain.  Neurological: Negative for dizziness, light-headedness and headaches.  Psychiatric/Behavioral: Positive for sleep disturbance. Negative for dysphoric mood and suicidal ideas. The patient is nervous/anxious and has insomnia.     Patient Active Problem List   Diagnosis Date Noted  . Essential hypertension 12/28/2019  . Dysplasia of cervix, low grade (CIN 1) 12/28/2019  . Carpal tunnel syndrome 05/30/2014    No Known Allergies  Past Surgical History:  Procedure Laterality Date  . arm surgery    . BREAST BIOPSY Right 2015   papilloma  . BREAST EXCISIONAL BIOPSY Right 2015   neg  . CESAREAN SECTION    . COLONOSCOPY    . GANGLION CYST EXCISION Right   . KNEE ARTHROSCOPY WITH MEDIAL MENISECTOMY Left 04/22/2016   Procedure: KNEE ARTHROSCOPY WITH MEDIAL MENISECTOMY;  Surgeon: Kennedy Bucker, MD;  Location: [facility] ORS;  Service: Orthopedics;  Laterality: Left;  . TONSILLECTOMY    . TRIGGER FINGER RELEASE Right     Social History   Tobacco Use  . Smoking status: Never Smoker  . Smokeless tobacco: Never Used  Substance Use Topics  . Alcohol use: No  . Drug use: No     Medication list has been reviewed and updated.  Current Meds  Medication Sig  . amLODipine (NORVASC)  10 MG tablet Take 1 tablet (10 mg total) by mouth daily.  . calcium carbonate (OS-CAL - DOSED IN MG OF ELEMENTAL CALCIUM) 1250 (500 Ca) MG tablet Take 1 tablet by mouth daily.  . cetirizine (ZYRTEC) 10 MG tablet Take 10 mg by mouth daily.  . Cholecalciferol (VITAMIN D3) 5000 units TABS Take 1 tablet by mouth daily.  . Multiple Vitamin (MULTIVITAMIN) tablet Take 1 tablet by mouth daily.  . Omega-3 Fatty Acids (FISH OIL PO) Take 1 tablet by mouth daily.  . [DISCONTINUED]  amLODipine (NORVASC) 10 MG tablet Take 1 tablet (10 mg total) by mouth daily.    PHQ 2/9 Scores 05/01/2020 12/28/2019  PHQ - 2 Score 0 0  PHQ- 9 Score 2 0   GAD 7 : Generalized Anxiety Score 05/01/2020  Nervous, Anxious, on Edge 1  Control/stop worrying 1  Worry too much - different things 1  Trouble relaxing 1  Restless 1  Easily annoyed or irritable 0  Afraid - awful might happen 1  Total GAD 7 Score 6  Anxiety Difficulty Not difficult at all     BP Readings from Last 3 Encounters:  05/01/20 (!) 142/64  12/28/19 138/62  11/13/19 (!) 186/78    Physical Exam Vitals and nursing note reviewed.  Constitutional:      General: She is not in acute distress.    Appearance: Normal appearance. She is well-developed.  HENT:     Head: Normocephalic and atraumatic.  Cardiovascular:     Rate and Rhythm: Normal rate and regular rhythm.     Pulses: Normal pulses.  Pulmonary:     Effort: Pulmonary effort is normal. No respiratory distress.     Breath sounds: No wheezing or rhonchi.  Musculoskeletal:     Cervical back: Normal range of motion.     Right lower leg: No edema.     Left lower leg: No edema.  Lymphadenopathy:     Cervical: No cervical adenopathy.  Skin:    General: Skin is warm and dry.     Capillary Refill: Capillary refill takes less than 2 seconds.     Findings: No rash.  Neurological:     General: No focal deficit present.     Mental Status: She is alert and oriented to person, place, and time.  Psychiatric:        Mood and Affect: Mood normal.        Behavior: Behavior normal.        Thought Content: Thought content normal.     Wt Readings from Last 3 Encounters:  05/01/20 183 lb (83 kg)  12/28/19 183 lb (83 kg)  11/13/19 183 lb 9.6 oz (83.3 kg)    BP (!) 142/64   Pulse 74   Temp 98.2 F (36.8 C) (Oral)   Ht 5' (1.524 m)   Wt 183 lb (83 kg)   SpO2 98%   BMI 35.74 kg/m   Assessment and Plan: 1. Essential hypertension Clinically stable exam with  well controlled BP on amlodipine. Tolerating medications without side effects at this time. Pt to continue current regimen and low sodium diet; benefits of regular exercise as able discussed. - amLODipine (NORVASC) 10 MG tablet; Take 1 tablet (10 mg total) by mouth daily.  Dispense: 90 tablet; Refill: 1  2. Colon cancer screening Consultation is scheduled at [facility]  3. Other insomnia With associated episodes of anxiety - recommend life style changes/exercise/meditation without medication during the day Will give sonata for use in sleep Follow  up if sx are worsening - consider use of Vistaril as needed during the day - zaleplon (SONATA) 10 MG capsule; Take 1 capsule (10 mg total) by mouth at bedtime as needed for sleep.  Dispense: 30 capsule; Refill: 0   Partially dictated using Editor, commissioning. Any errors are unintentional.  Halina Maidens, MD [facility]  05/01/2020

## 2020-05-14 ENCOUNTER — Ambulatory Visit (INDEPENDENT_AMBULATORY_CARE_PROVIDER_SITE_OTHER): Payer: BC Managed Care – PPO | Admitting: Obstetrics and Gynecology

## 2020-05-14 ENCOUNTER — Other Ambulatory Visit: Payer: Self-pay

## 2020-05-14 ENCOUNTER — Other Ambulatory Visit (HOSPITAL_COMMUNITY)
Admission: RE | Admit: 2020-05-14 | Discharge: 2020-05-14 | Disposition: A | Discharge status: Home / Self Care | Payer: BC Managed Care – PPO | Source: Ambulatory Visit | Attending: Obstetrics and Gynecology | Admitting: Obstetrics and Gynecology

## 2020-05-14 ENCOUNTER — Encounter: Payer: Self-pay | Admitting: Obstetrics and Gynecology

## 2020-05-14 VITALS — BP 161/75 | HR 73 | Ht 60.0 in | Wt 186.2 lb

## 2020-05-14 DIAGNOSIS — N870 Mild cervical dysplasia: Secondary | ICD-10-CM | POA: Insufficient documentation

## 2020-05-14 DIAGNOSIS — B977 Papillomavirus as the cause of diseases classified elsewhere: Secondary | ICD-10-CM

## 2020-05-14 DIAGNOSIS — N72 Inflammatory disease of cervix uteri: Secondary | ICD-10-CM | POA: Insufficient documentation

## 2020-05-14 NOTE — Progress Notes (Signed)
Referring Provider:    HPI:  Chelsea [name] is a 64 y.o.  No obstetric history on file.  who presents today for evaluation and management of abnormal cervical cytology.    Dysplasia History: CIN-1 by colposcopically directed biopsies.  Type 16 HPV.  ROS:  Pertinent items noted in HPI and remainder of comprehensive ROS otherwise negative.  OB History  No obstetric history on file.    Past Medical History:  Diagnosis Date  . Diverticulosis   . Hypertension     Past Surgical History:  Procedure Laterality Date  . arm surgery    . BREAST BIOPSY Right 2015   papilloma  . BREAST EXCISIONAL BIOPSY Right 2015   neg  . CESAREAN SECTION    . COLONOSCOPY    . GANGLION CYST EXCISION Right   . KNEE ARTHROSCOPY WITH MEDIAL MENISECTOMY Left 04/22/2016   Procedure: KNEE ARTHROSCOPY WITH MEDIAL MENISECTOMY;  Surgeon: Kennedy Bucker, MD;  Location: [facility] ORS;  Service: Orthopedics;  Laterality: Left;  . TONSILLECTOMY    . TRIGGER FINGER RELEASE Right     SOCIAL HISTORY: Social History   Substance and Sexual Activity  Alcohol Use No   Social History   Substance and Sexual Activity  Drug Use No     Family History  Problem Relation Age of Onset  . Heart attack Mother 51  . Breast cancer Mother   . Kidney disease Father   . Diabetes Father     ALLERGIES:  Patient has no known allergies.  She has a current medication list which includes the following prescription(s): amlodipine, calcium carbonate, cetirizine, vitamin d3, multivitamin, omega-3 fatty acids, zaleplon, and [DISCONTINUED] hydrochlorothiazide.  Physical Exam: -Vitals:  BP (!) 161/75   Pulse 73   Ht 5' (1.524 m)   Wt 186 lb 3.2 oz (84.5 kg)   BMI 36.36 kg/m   PROCEDURE: Colposcopy performed with 4% acetic acid after verbal consent obtained                           -Aceto-white Lesions Location(s): See above              -Biopsy performed at 9 and 6 o'clock               -ECC indicated and performed: No.  -Biopsy sites made hemostatic with pressure and Monsel's solution   -Satisfactory colposcopy: Yes.      -Evidence of Invasive cervical CA :  NO  ASSESSMENT:  Chelsea [name] is a 64 y.o. No obstetric history on file. here for  1. CIN I (cervical intraepithelial neoplasia I)   2. High risk human papilloma virus (HPV) infection of cervix   .  PLAN: 1.  I discussed the grading system of pap smears and HPV high risk viral types.  We will discuss management after colpo results return.  No orders of the defined types were placed in this encounter.          F/U  Return in about 2 weeks (around 05/28/2020) for Colpo f/u.  Brennan Bailey ,MD 05/14/2020,10:54 AM

## 2020-05-14 NOTE — Addendum Note (Signed)
Addended by: Dorian Pod on: 05/14/2020 11:15 AM   Modules accepted: Orders

## 2020-05-15 LAB — SURGICAL PATHOLOGY

## 2020-05-28 ENCOUNTER — Encounter: Payer: Self-pay | Admitting: Obstetrics and Gynecology

## 2020-05-28 ENCOUNTER — Ambulatory Visit: Payer: BC Managed Care – PPO | Admitting: Obstetrics and Gynecology

## 2020-05-28 ENCOUNTER — Other Ambulatory Visit: Payer: Self-pay

## 2020-05-28 VITALS — BP 165/70 | HR 66 | Ht 60.0 in | Wt 185.7 lb

## 2020-05-28 DIAGNOSIS — N72 Inflammatory disease of cervix uteri: Secondary | ICD-10-CM | POA: Diagnosis not present

## 2020-05-28 DIAGNOSIS — B977 Papillomavirus as the cause of diseases classified elsewhere: Secondary | ICD-10-CM

## 2020-05-28 NOTE — Progress Notes (Signed)
HPI:      [name] is a 64 y.o. No obstetric history on file. who LMP was No LMP recorded. Patient is postmenopausal.  Subjective:   She presents today for follow-up of her colposcopy.  She previously had CIN-1 by colposcopically directed biopsies with type 16 HPV.  This is a 47-month follow-up from her last colpo.    Hx: The following portions of the patient's history were reviewed and updated as appropriate:             She  has a past medical history of Diverticulosis and Hypertension. She does not have any pertinent problems on file. She  has a past surgical history that includes arm surgery; Cesarean section; Ganglion cyst excision (Right); Trigger finger release (Right); Colonoscopy; Tonsillectomy; Knee arthroscopy with medial menisectomy (Left, 04/22/2016); Breast biopsy (Right, 2015); and Breast excisional biopsy (Right, 2015). Her family history includes Breast cancer in her mother; Diabetes in her father; Heart attack (age of onset: 48) in her mother; Kidney disease in her father. She  reports that she has never smoked. She has never used smokeless tobacco. She reports that she does not drink alcohol and does not use drugs. She has a current medication list which includes the following prescription(s): amlodipine, calcium carbonate, cetirizine, vitamin d3, multivitamin, omega-3 fatty acids, zaleplon, and [DISCONTINUED] hydrochlorothiazide. She has No Known Allergies.       Review of Systems:  Review of Systems  Constitutional: Denied constitutional symptoms, night sweats, recent illness, fatigue, fever, insomnia and weight loss.  Eyes: Denied eye symptoms, eye pain, photophobia, vision change and visual disturbance.  Ears/Nose/Throat/Neck: Denied ear, nose, throat or neck symptoms, hearing loss, nasal discharge, sinus congestion and sore throat.  Cardiovascular: Denied cardiovascular symptoms, arrhythmia, chest pain/pressure, edema, exercise intolerance, orthopnea and  palpitations.  Respiratory: Denied pulmonary symptoms, asthma, pleuritic pain, productive sputum, cough, dyspnea and wheezing.  Gastrointestinal: Denied, gastro-esophageal reflux, melena, nausea and vomiting.  Genitourinary: Denied genitourinary symptoms including symptomatic vaginal discharge, pelvic relaxation issues, and urinary complaints.  Musculoskeletal: Denied musculoskeletal symptoms, stiffness, swelling, muscle weakness and myalgia.  Dermatologic: Denied dermatology symptoms, rash and scar.  Neurologic: Denied neurology symptoms, dizziness, headache, neck pain and syncope.  Psychiatric: Denied psychiatric symptoms, anxiety and depression.  Endocrine: Denied endocrine symptoms including hot flashes and night sweats.   Meds:   Current Outpatient Medications on File Prior to Visit  Medication Sig Dispense Refill  . amLODipine (NORVASC) 10 MG tablet Take 1 tablet (10 mg total) by mouth daily. 90 tablet 1  . calcium carbonate (OS-CAL - DOSED IN MG OF ELEMENTAL CALCIUM) 1250 (500 Ca) MG tablet Take 1 tablet by mouth daily.    . cetirizine (ZYRTEC) 10 MG tablet Take 10 mg by mouth daily.    . Cholecalciferol (VITAMIN D3) 5000 units TABS Take 1 tablet by mouth daily.    . Multiple Vitamin (MULTIVITAMIN) tablet Take 1 tablet by mouth daily.    . Omega-3 Fatty Acids (FISH OIL PO) Take 1 tablet by mouth daily.    . zaleplon (SONATA) 10 MG capsule Take 1 capsule (10 mg total) by mouth at bedtime as needed for sleep. 30 capsule 0  . [DISCONTINUED] hydrochlorothiazide (HYDRODIURIL) 25 MG tablet Take 25 mg by mouth daily.     No current facility-administered medications on file prior to visit.    Objective:     Vitals:   05/28/20 1545  BP: (!) 165/70  Pulse: 66  Assessment:    No obstetric history on file. Patient Active Problem List   Diagnosis Date Noted  . Other insomnia 05/01/2020  . Essential hypertension 12/28/2019  . Dysplasia of cervix, low grade (CIN 1)  12/28/2019  . Carpal tunnel syndrome 05/30/2014     1. High risk human papilloma virus (HPV) infection of cervix     No evidence of CIN by colposcopically directed biopsies.   Patient does have a history of type 16 HPV.   Plan:            1.  Recommend follow-up Pap smear in 1 year.  2.  Return for annual examination as needed.  Discussed the natural course and history of HPV as well as its relationship to CIN and to cervical cancer.  All questions answered. Orders No orders of the defined types were placed in this encounter.   No orders of the defined types were placed in this encounter.     F/U  Return for Annual Physical. I spent 12 minutes involved in the care of this patient preparing to see the patient by obtaining and reviewing her medical history (including labs, imaging tests and prior procedures), documenting clinical information in the electronic health record (EHR), counseling and coordinating care plans, writing and sending prescriptions, ordering tests or procedures and directly communicating with the patient by discussing pertinent items from her history and physical exam as well as detailing my assessment and plan as noted above so that she has an informed understanding.  All of her questions were answered.  Finis Bud, M.D. 05/28/2020 4:01 PM

## 2020-08-19 LAB — HM COLONOSCOPY

## 2020-10-14 ENCOUNTER — Ambulatory Visit (INDEPENDENT_AMBULATORY_CARE_PROVIDER_SITE_OTHER): Payer: BC Managed Care – PPO | Admitting: Obstetrics and Gynecology

## 2020-10-14 ENCOUNTER — Encounter: Payer: BC Managed Care – PPO | Admitting: Obstetrics and Gynecology

## 2020-10-14 ENCOUNTER — Other Ambulatory Visit: Payer: Self-pay

## 2020-10-14 ENCOUNTER — Encounter: Payer: Self-pay | Admitting: Obstetrics and Gynecology

## 2020-10-14 ENCOUNTER — Other Ambulatory Visit (HOSPITAL_COMMUNITY)
Admission: RE | Admit: 2020-10-14 | Discharge: 2020-10-14 | Disposition: A | Discharge status: Home / Self Care | Payer: BC Managed Care – PPO | Source: Ambulatory Visit | Attending: Obstetrics and Gynecology | Admitting: Obstetrics and Gynecology

## 2020-10-14 VITALS — BP 171/76 | HR 67 | Ht 60.0 in | Wt 188.6 lb

## 2020-10-14 DIAGNOSIS — Z23 Encounter for immunization: Secondary | ICD-10-CM

## 2020-10-14 DIAGNOSIS — Z01419 Encounter for gynecological examination (general) (routine) without abnormal findings: Secondary | ICD-10-CM

## 2020-10-14 DIAGNOSIS — Z1231 Encounter for screening mammogram for malignant neoplasm of breast: Secondary | ICD-10-CM | POA: Diagnosis not present

## 2020-10-14 DIAGNOSIS — Z124 Encounter for screening for malignant neoplasm of cervix: Secondary | ICD-10-CM

## 2020-10-14 NOTE — Addendum Note (Signed)
Addended by: Dorian Pod on: 10/14/2020 09:48 AM   Modules accepted: Orders

## 2020-10-14 NOTE — Progress Notes (Signed)
HPI:      [name] is a 64 y.o. No obstetric history on file. who LMP was No LMP recorded. Patient is postmenopausal.  Subjective:   She presents today for her annual examination.  She has no complaints.  She says she is doing well.  She is fasting for blood work today. Of significant note patient has a history of HPV type 16 but her last colposcopy was negative.    Hx: The following portions of the patient's history were reviewed and updated as appropriate:             She  has a past medical history of Diverticulosis and Hypertension. She does not have any pertinent problems on file. She  has a past surgical history that includes arm surgery; Cesarean section; Ganglion cyst excision (Right); Trigger finger release (Right); Colonoscopy; Tonsillectomy; Knee arthroscopy with medial menisectomy (Left, 04/22/2016); Breast biopsy (Right, 2015); and Breast excisional biopsy (Right, 2015). Her family history includes Breast cancer in her mother; Diabetes in her father; Heart attack (age of onset: 12) in her mother; Kidney disease in her father. She  reports that she has never smoked. She has never used smokeless tobacco. She reports that she does not drink alcohol and does not use drugs. She has a current medication list which includes the following prescription(s): amlodipine, calcium carbonate, cetirizine, vitamin d3, multivitamin, omega-3 fatty acids, zaleplon, and [DISCONTINUED] hydrochlorothiazide. She has No Known Allergies.       Review of Systems:  Review of Systems  Constitutional: Denied constitutional symptoms, night sweats, recent illness, fatigue, fever, insomnia and weight loss.  Eyes: Denied eye symptoms, eye pain, photophobia, vision change and visual disturbance.  Ears/Nose/Throat/Neck: Denied ear, nose, throat or neck symptoms, hearing loss, nasal discharge, sinus congestion and sore throat.  Cardiovascular: Denied cardiovascular symptoms, arrhythmia, chest  pain/pressure, edema, exercise intolerance, orthopnea and palpitations.  Respiratory: Denied pulmonary symptoms, asthma, pleuritic pain, productive sputum, cough, dyspnea and wheezing.  Gastrointestinal: Denied, gastro-esophageal reflux, melena, nausea and vomiting.  Genitourinary: Denied genitourinary symptoms including symptomatic vaginal discharge, pelvic relaxation issues, and urinary complaints.  Musculoskeletal: Denied musculoskeletal symptoms, stiffness, swelling, muscle weakness and myalgia.  Dermatologic: Denied dermatology symptoms, rash and scar.  Neurologic: Denied neurology symptoms, dizziness, headache, neck pain and syncope.  Psychiatric: Denied psychiatric symptoms, anxiety and depression.  Endocrine: Denied endocrine symptoms including hot flashes and night sweats.   Meds:   Current Outpatient Medications on File Prior to Visit  Medication Sig Dispense Refill  . amLODipine (NORVASC) 10 MG tablet Take 1 tablet (10 mg total) by mouth daily. 90 tablet 1  . calcium carbonate (OS-CAL - DOSED IN MG OF ELEMENTAL CALCIUM) 1250 (500 Ca) MG tablet Take 1 tablet by mouth daily.    . cetirizine (ZYRTEC) 10 MG tablet Take 10 mg by mouth daily.    . Cholecalciferol (VITAMIN D3) 5000 units TABS Take 1 tablet by mouth daily.    . Multiple Vitamin (MULTIVITAMIN) tablet Take 1 tablet by mouth daily.    . Omega-3 Fatty Acids (FISH OIL PO) Take 1 tablet by mouth daily.    . zaleplon (SONATA) 10 MG capsule Take 1 capsule (10 mg total) by mouth at bedtime as needed for sleep. 30 capsule 0  . [DISCONTINUED] hydrochlorothiazide (HYDRODIURIL) 25 MG tablet Take 25 mg by mouth daily.     No current facility-administered medications on file prior to visit.          Objective:     Vitals:  10/14/20 0811  BP: (!) 171/76  Pulse: 67    Filed Weights   10/14/20 0811  Weight: 188 lb 9.6 oz (85.5 kg)              Physical examination General NAD, Conversant  HEENT Atraumatic; Op clear  with mmm.  Normo-cephalic. Pupils reactive. Anicteric sclerae  Thyroid/Neck Smooth without nodularity or enlargement. Normal ROM.  Neck Supple.  Skin No rashes, lesions or ulceration. Normal palpated skin turgor. No nodularity.  Breasts: No masses or discharge.  Symmetric.  No axillary adenopathy.  Lungs: Clear to auscultation.No rales or wheezes. Normal Respiratory effort, no retractions.  Heart: NSR.  No murmurs or rubs appreciated. No periferal edema  Abdomen: Soft.  Non-tender.  No masses.  No HSM. No hernia  Extremities: Moves all appropriately.  Normal ROM for age. No lymphadenopathy.  Neuro: Oriented to PPT.  Normal mood. Normal affect.     Pelvic:   Vulva: Normal appearance.  No lesions.  Vagina: No lesions or abnormalities noted.  Support: Normal pelvic support.  Urethra No masses tenderness or scarring.  Meatus Normal size without lesions or prolapse.  Cervix: Normal appearance.  No lesions.  Anus: Normal exam.  No lesions.  Perineum: Normal exam.  No lesions.        Bimanual   Uterus: Normal size.  Non-tender.  Mobile.  AV.  Adnexae: No masses.  Non-tender to palpation.  Cul-de-sac: Negative for abnormality.      Assessment:    No obstetric history on file. Patient Active Problem List   Diagnosis Date Noted  . Other insomnia 05/01/2020  . Essential hypertension 12/28/2019  . Dysplasia of cervix, low grade (CIN 1) 12/28/2019  . Carpal tunnel syndrome 05/30/2014     1. Well woman exam with routine gynecological exam   2. Encounter for screening mammogram for malignant neoplasm of breast     Normal exam.   Plan:            1.  Basic Screening Recommendations The basic screening recommendations for asymptomatic women were discussed with the patient during her visit.  The age-appropriate recommendations were discussed with her and the rational for the tests reviewed.  When I am informed by the patient that another primary care physician has previously obtained  the age-appropriate tests and they are up-to-date, only outstanding tests are ordered and referrals given as necessary.  Abnormal results of tests will be discussed with her when all of her results are completed.  Routine preventative health maintenance measures emphasized: Exercise/Diet/Weight control, Tobacco Warnings, Alcohol/Substance use risks and Stress Management Pap performed-annual lab work today.  Mammogram due in April. We will contact her regarding any abnormalities on her Pap and make a decision regarding follow-ups. Orders Orders Placed This Encounter  Procedures  . MM 3D SCREEN BREAST BILATERAL  . Lipid panel  . Hemoglobin A1c  . TSH    No orders of the defined types were placed in this encounter.           F/U  No follow-ups on file.  Elonda Husky, M.D. 10/14/2020 8:35 AM

## 2020-10-15 LAB — LIPID PANEL
Chol/HDL Ratio: 3.3 ratio (ref 0.0–4.4)
Cholesterol, Total: 185 mg/dL (ref 100–199)
HDL: 56 mg/dL (ref >39)
LDL Chol Calc (NIH): 112 mg/dL — ABNORMAL HIGH (ref 0–99)
Triglycerides: 94 mg/dL (ref 0–149)
VLDL Cholesterol Cal: 17 mg/dL (ref 5–40)

## 2020-10-15 LAB — HEMOGLOBIN A1C
Est. average glucose Bld gHb Est-mCnc: 108 mg/dL
Hgb A1c MFr Bld: 5.4 % (ref 4.8–5.6)

## 2020-10-15 LAB — TSH: TSH: 1.86 u[IU]/mL (ref 0.450–4.500)

## 2020-10-20 LAB — CYTOLOGY - PAP
Comment: NEGATIVE
Comment: NEGATIVE
HPV 16: POSITIVE — AB
HPV 18 / 45: NEGATIVE
High risk HPV: POSITIVE — AB

## 2020-11-04 ENCOUNTER — Ambulatory Visit: Payer: BC Managed Care – PPO | Admitting: Internal Medicine

## 2020-11-12 ENCOUNTER — Other Ambulatory Visit: Payer: Self-pay | Admitting: Internal Medicine

## 2020-11-12 DIAGNOSIS — I10 Essential (primary) hypertension: Secondary | ICD-10-CM

## 2020-11-13 ENCOUNTER — Encounter: Payer: BC Managed Care – PPO | Admitting: Obstetrics and Gynecology

## 2020-11-17 ENCOUNTER — Encounter: Payer: BC Managed Care – PPO | Admitting: Internal Medicine

## 2020-11-17 ENCOUNTER — Telehealth: Payer: Self-pay

## 2020-11-17 NOTE — Telephone Encounter (Signed)
Please review Dr. Karn Cassis note. Patient did not show for appt today. She needs to be seen before the end of the year. Please call her to reschedule her appt to follow up on her blood pressure.

## 2020-11-17 NOTE — Telephone Encounter (Signed)
-----   Message from Reubin Milan, MD sent at 11/17/2020 10:47 AM EST ----- Patient did not come for her BP follow up today..  she must be seen before the end of the year.  Please call and reschedule.

## 2020-11-17 NOTE — Progress Notes (Deleted)
Date:  11/17/2020   Name:  Chelsea [name]   DOB:  [birth year]   MRN:  [id number]   Chief Complaint: No chief complaint on file.  Hypertension This is a chronic problem. The problem is controlled. Pertinent negatives include no chest pain, headaches, palpitations or shortness of breath. There are no associated agents to hypertension. Past treatments include calcium channel blockers. The current treatment provides significant improvement. There are no compliance problems.     Lab Results  Component Value Date   CREATININE 0.82 12/28/2019   BUN 15 12/28/2019   NA 142 12/28/2019   K 4.0 12/28/2019   CL 106 12/28/2019   CO2 24 12/28/2019   Lab Results  Component Value Date   CHOL 185 10/14/2020   HDL 56 10/14/2020   LDLCALC 112 (H) 10/14/2020   TRIG 94 10/14/2020   CHOLHDL 3.3 10/14/2020   Lab Results  Component Value Date   TSH 1.860 10/14/2020   Lab Results  Component Value Date   HGBA1C 5.4 10/14/2020   Lab Results  Component Value Date   WBC 5.8 11/04/2019   HGB 12.4 11/04/2019   HCT 39.9 11/04/2019   MCV 83.1 11/04/2019   PLT 261 11/04/2019   Lab Results  Component Value Date   ALT 16 11/04/2019   AST 21 11/04/2019   ALKPHOS 100 11/04/2019   BILITOT 0.3 11/04/2019     Review of Systems  Constitutional: Negative for appetite change, fatigue, fever and unexpected weight change.  Eyes: Negative for visual disturbance.  Respiratory: Negative for cough, chest tightness and shortness of breath.   Cardiovascular: Negative for chest pain, palpitations and leg swelling.  Gastrointestinal: Negative for abdominal pain.  Genitourinary: Negative for dysuria and hematuria.  Musculoskeletal: Negative for arthralgias.  Neurological: Negative for dizziness, tremors, numbness and headaches.  Psychiatric/Behavioral: Negative for dysphoric mood.    Patient Active Problem List   Diagnosis Date Noted  . Other insomnia 05/01/2020  . Essential hypertension 12/28/2019    . Dysplasia of cervix, low grade (CIN 1) 12/28/2019  . Carpal tunnel syndrome 05/30/2014    No Known Allergies  Past Surgical History:  Procedure Laterality Date  . arm surgery    . BREAST BIOPSY Right 2015   papilloma  . BREAST EXCISIONAL BIOPSY Right 2015   neg  . CESAREAN SECTION    . COLONOSCOPY    . GANGLION CYST EXCISION Right   . KNEE ARTHROSCOPY WITH MEDIAL MENISECTOMY Left 04/22/2016   Procedure: KNEE ARTHROSCOPY WITH MEDIAL MENISECTOMY;  Surgeon: Kennedy Bucker, MD;  Location: [facility] ORS;  Service: Orthopedics;  Laterality: Left;  . TONSILLECTOMY    . TRIGGER FINGER RELEASE Right     Social History   Tobacco Use  . Smoking status: Never Smoker  . Smokeless tobacco: Never Used  Vaping Use  . Vaping Use: Never used  Substance Use Topics  . Alcohol use: No  . Drug use: No     Medication list has been reviewed and updated.  No outpatient medications have been marked as taking for the 11/17/20 encounter (Appointment) with Reubin Milan, MD.    [facility] 2/9 Scores 05/01/2020 12/28/2019  PHQ - 2 Score 0 0  PHQ- 9 Score 2 0    GAD 7 : Generalized Anxiety Score 05/01/2020  Nervous, Anxious, on Edge 1  Control/stop worrying 1  Worry too much - different things 1  Trouble relaxing 1  Restless 1  Easily annoyed or irritable 0  Afraid - awful  might happen 1  Total GAD 7 Score 6  Anxiety Difficulty Not difficult at all    BP Readings from Last 3 Encounters:  10/14/20 (!) 171/76  05/28/20 (!) 165/70  05/14/20 (!) 161/75    Physical Exam  Wt Readings from Last 3 Encounters:  10/14/20 188 lb 9.6 oz (85.5 kg)  05/28/20 185 lb 11.2 oz (84.2 kg)  05/14/20 186 lb 3.2 oz (84.5 kg)    There were no vitals taken for this visit.  Assessment and Plan:

## 2020-11-17 NOTE — Telephone Encounter (Signed)
Pt set up to come in 11/20/20

## 2020-11-20 ENCOUNTER — Ambulatory Visit: Payer: BC Managed Care – PPO | Admitting: Internal Medicine

## 2020-11-25 ENCOUNTER — Other Ambulatory Visit: Payer: Self-pay

## 2020-11-25 ENCOUNTER — Encounter: Payer: Self-pay | Admitting: Internal Medicine

## 2020-11-25 ENCOUNTER — Ambulatory Visit: Payer: BC Managed Care – PPO | Admitting: Internal Medicine

## 2020-11-25 VITALS — BP 139/68 | HR 98 | Temp 98.7°F | Ht 60.0 in | Wt 190.0 lb

## 2020-11-25 DIAGNOSIS — N870 Mild cervical dysplasia: Secondary | ICD-10-CM | POA: Diagnosis not present

## 2020-11-25 DIAGNOSIS — I10 Essential (primary) hypertension: Secondary | ICD-10-CM

## 2020-11-25 MED ORDER — AMLODIPINE BESYLATE 10 MG PO TABS: 10.0000 mg | ORAL_TABLET | Freq: Every day | ORAL | 1 refills | Status: DC

## 2020-11-25 MED ORDER — TRIAMTERENE-HCTZ 37.5-25 MG PO TABS: 1.0000 | ORAL_TABLET | Freq: Every day | ORAL | 1 refills | Status: DC

## 2020-11-25 NOTE — Progress Notes (Signed)
Date:  11/25/2020   Name:  Chelsea [name]   DOB:  19-Jan-1956   MRN:  [id number]   Chief Complaint: Hypertension  Hypertension This is a chronic problem. The problem is controlled (often up to 150 at home). Pertinent negatives include no chest pain, headaches, palpitations or shortness of breath. Risk factors for coronary artery disease include sedentary lifestyle and obesity. Past treatments include calcium channel blockers. The current treatment provides significant improvement. Compliance problems: not taking it at the same time every day.     Lab Results  Component Value Date   CREATININE 0.82 12/28/2019   BUN 15 12/28/2019   NA 142 12/28/2019   K 4.0 12/28/2019   CL 106 12/28/2019   CO2 24 12/28/2019   Lab Results  Component Value Date   CHOL 185 10/14/2020   HDL 56 10/14/2020   LDLCALC 112 (H) 10/14/2020   TRIG 94 10/14/2020   CHOLHDL 3.3 10/14/2020   Lab Results  Component Value Date   TSH 1.860 10/14/2020   Lab Results  Component Value Date   HGBA1C 5.4 10/14/2020   Lab Results  Component Value Date   WBC 5.8 11/04/2019   HGB 12.4 11/04/2019   HCT 39.9 11/04/2019   MCV 83.1 11/04/2019   PLT 261 11/04/2019   Lab Results  Component Value Date   ALT 16 11/04/2019   AST 21 11/04/2019   ALKPHOS 100 11/04/2019   BILITOT 0.3 11/04/2019     Review of Systems  Constitutional: Positive for unexpected weight change. Negative for chills, fatigue and fever.  Respiratory: Negative for cough, chest tightness and shortness of breath.   Cardiovascular: Negative for chest pain, palpitations and leg swelling.  Gastrointestinal: Negative for abdominal pain and constipation.  Neurological: Negative for dizziness and headaches.  Psychiatric/Behavioral: Positive for sleep disturbance (responds well to sonata). Negative for dysphoric mood. The patient is not nervous/anxious.     Patient Active Problem List   Diagnosis Date Noted  . Other insomnia 05/01/2020  .  Essential hypertension 12/28/2019  . Dysplasia of cervix, low grade (CIN 1) 12/28/2019  . Carpal tunnel syndrome 05/30/2014    No Known Allergies  Past Surgical History:  Procedure Laterality Date  . arm surgery    . BREAST BIOPSY Right 2015   papilloma  . BREAST EXCISIONAL BIOPSY Right 2015   neg  . CESAREAN SECTION    . COLONOSCOPY    . GANGLION CYST EXCISION Right   . KNEE ARTHROSCOPY WITH MEDIAL MENISECTOMY Left 04/22/2016   Procedure: KNEE ARTHROSCOPY WITH MEDIAL MENISECTOMY;  Surgeon: Kennedy Bucker, MD;  Location: [facility] ORS;  Service: Orthopedics;  Laterality: Left;  . TONSILLECTOMY    . TRIGGER FINGER RELEASE Right     Social History   Tobacco Use  . Smoking status: Never Smoker  . Smokeless tobacco: Never Used  Vaping Use  . Vaping Use: Never used  Substance Use Topics  . Alcohol use: No  . Drug use: No     Medication list has been reviewed and updated.  Current Meds  Medication Sig  . calcium carbonate (OS-CAL - DOSED IN MG OF ELEMENTAL CALCIUM) 1250 (500 Ca) MG tablet Take 1 tablet by mouth daily.  . cetirizine (ZYRTEC) 10 MG tablet Take 10 mg by mouth daily.  . Cholecalciferol (VITAMIN D3) 5000 units TABS Take 1 tablet by mouth daily.  . Multiple Vitamin (MULTIVITAMIN) tablet Take 1 tablet by mouth daily.  . Omega-3 Fatty Acids (FISH OIL PO) Take  1 tablet by mouth daily.  . zaleplon (SONATA) 10 MG capsule Take 1 capsule (10 mg total) by mouth at bedtime as needed for sleep.  . [DISCONTINUED] amLODipine (NORVASC) 10 MG tablet TAKE 1 TABLET BY MOUTH EVERY DAY    PHQ 2/9 Scores 11/25/2020 05/01/2020 12/28/2019  PHQ - 2 Score 0 0 0  PHQ- 9 Score 0 2 0    GAD 7 : Generalized Anxiety Score 11/25/2020 05/01/2020  Nervous, Anxious, on Edge 0 1  Control/stop worrying 0 1  Worry too much - different things 0 1  Trouble relaxing 0 1  Restless 0 1  Easily annoyed or irritable 0 0  Afraid - awful might happen 0 1  Total GAD 7 Score 0 6  Anxiety Difficulty - Not  difficult at all    BP Readings from Last 3 Encounters:  11/25/20 139/68  10/14/20 (!) 171/76  05/28/20 (!) 165/70    Physical Exam Vitals and nursing note reviewed.  Constitutional:      General: She is not in acute distress.    Appearance: She is well-developed. She is obese.  HENT:     Head: Normocephalic and atraumatic.  Cardiovascular:     Rate and Rhythm: Normal rate and regular rhythm.  Pulmonary:     Effort: Pulmonary effort is normal. No respiratory distress.     Breath sounds: No wheezing or rhonchi.  Musculoskeletal:     Cervical back: Normal range of motion.     Right lower leg: No edema.     Left lower leg: No edema.  Lymphadenopathy:     Cervical: No cervical adenopathy.  Skin:    General: Skin is warm and dry.     Findings: No rash.  Neurological:     General: No focal deficit present.     Mental Status: She is alert and oriented to person, place, and time.  Psychiatric:        Mood and Affect: Mood and affect and mood normal.     Wt Readings from Last 3 Encounters:  11/25/20 190 lb (86.2 kg)  10/14/20 188 lb 9.6 oz (85.5 kg)  05/28/20 185 lb 11.2 oz (84.2 kg)    BP 139/68   Pulse 98   Temp 98.7 F (37.1 C) (Oral)   Ht 5' (1.524 m)   Wt 190 lb (86.2 kg)   SpO2 98%   BMI 37.11 kg/m   Assessment and Plan: 1. Essential hypertension BP not well controlled here or at home Continue amlodipine and add Maxzide 25 mg Patient to monitor BP at home and call or follow up if not improved; otherwise f/u in 4 months - triamterene-hydrochlorothiazide (MAXZIDE-25) 37.5-25 MG tablet; Take 1 tablet by mouth daily.  Dispense: 90 tablet; Refill: 1 - amLODipine (NORVASC) 10 MG tablet; Take 1 tablet (10 mg total) by mouth daily.  Dispense: 90 tablet; Refill: 1   Partially dictated using Animal nutritionist. Any errors are unintentional.  Bari Edward, MD [facility]  11/25/2020

## 2020-11-26 ENCOUNTER — Ambulatory Visit: Payer: BC Managed Care – PPO | Admitting: Obstetrics and Gynecology

## 2020-11-26 ENCOUNTER — Encounter: Payer: Self-pay | Admitting: Obstetrics and Gynecology

## 2020-11-26 VITALS — BP 156/84 | HR 76 | Ht 60.0 in | Wt 190.0 lb

## 2020-11-26 DIAGNOSIS — N72 Inflammatory disease of cervix uteri: Secondary | ICD-10-CM

## 2020-11-26 DIAGNOSIS — B977 Papillomavirus as the cause of diseases classified elsewhere: Secondary | ICD-10-CM

## 2020-11-26 DIAGNOSIS — R87612 Low grade squamous intraepithelial lesion on cytologic smear of cervix (LGSIL): Secondary | ICD-10-CM | POA: Diagnosis not present

## 2020-11-26 NOTE — Progress Notes (Signed)
HPI:      [name] is a 64 y.o. No obstetric history on file. who LMP was No LMP recorded. Patient is postmenopausal.  Subjective:   She presents today to discuss her most recent Pap smear which revealed LGSIL.  She is known to have positive high risk HPV type 16.  Over the course of the last few years patient has had CIN-1 diagnosis by colposcopy as well as a negative colposcopy with no CIN.    Hx: The following portions of the patient's history were reviewed and updated as appropriate:             She  has a past medical history of Diverticulosis and Hypertension. She does not have any pertinent problems on file. She  has a past surgical history that includes arm surgery; Cesarean section; Ganglion cyst excision (Right); Trigger finger release (Right); Colonoscopy; Tonsillectomy; Knee arthroscopy with medial menisectomy (Left, 04/22/2016); Breast biopsy (Right, 2015); and Breast excisional biopsy (Right, 2015). Her family history includes Breast cancer in her mother; Diabetes in her father; Heart attack (age of onset: 49) in her mother; Kidney disease in her father. She  reports that she has never smoked. She has never used smokeless tobacco. She reports that she does not drink alcohol and does not use drugs. She has a current medication list which includes the following prescription(s): amlodipine, calcium carbonate, cetirizine, vitamin d3, multivitamin, omega-3 fatty acids, triamterene-hydrochlorothiazide, zaleplon, and [DISCONTINUED] hydrochlorothiazide. She has No Known Allergies.       Review of Systems:  Review of Systems  Constitutional: Denied constitutional symptoms, night sweats, recent illness, fatigue, fever, insomnia and weight loss.  Eyes: Denied eye symptoms, eye pain, photophobia, vision change and visual disturbance.  Ears/Nose/Throat/Neck: Denied ear, nose, throat or neck symptoms, hearing loss, nasal discharge, sinus congestion and sore throat.  Cardiovascular:  Denied cardiovascular symptoms, arrhythmia, chest pain/pressure, edema, exercise intolerance, orthopnea and palpitations.  Respiratory: Denied pulmonary symptoms, asthma, pleuritic pain, productive sputum, cough, dyspnea and wheezing.  Gastrointestinal: Denied, gastro-esophageal reflux, melena, nausea and vomiting.  Genitourinary: Denied genitourinary symptoms including symptomatic vaginal discharge, pelvic relaxation issues, and urinary complaints.  Musculoskeletal: Denied musculoskeletal symptoms, stiffness, swelling, muscle weakness and myalgia.  Dermatologic: Denied dermatology symptoms, rash and scar.  Neurologic: Denied neurology symptoms, dizziness, headache, neck pain and syncope.  Psychiatric: Denied psychiatric symptoms, anxiety and depression.  Endocrine: Denied endocrine symptoms including hot flashes and night sweats.   Meds:   Current Outpatient Medications on File Prior to Visit  Medication Sig Dispense Refill  . amLODipine (NORVASC) 10 MG tablet Take 1 tablet (10 mg total) by mouth daily. 90 tablet 1  . calcium carbonate (OS-CAL - DOSED IN MG OF ELEMENTAL CALCIUM) 1250 (500 Ca) MG tablet Take 1 tablet by mouth daily.    . cetirizine (ZYRTEC) 10 MG tablet Take 10 mg by mouth daily.    . Cholecalciferol (VITAMIN D3) 5000 units TABS Take 1 tablet by mouth daily.    . Multiple Vitamin (MULTIVITAMIN) tablet Take 1 tablet by mouth daily.    . Omega-3 Fatty Acids (FISH OIL PO) Take 1 tablet by mouth daily.    Marland Kitchen triamterene-hydrochlorothiazide (MAXZIDE-25) 37.5-25 MG tablet Take 1 tablet by mouth daily. 90 tablet 1  . zaleplon (SONATA) 10 MG capsule Take 1 capsule (10 mg total) by mouth at bedtime as needed for sleep. 30 capsule 0  . [DISCONTINUED] hydrochlorothiazide (HYDRODIURIL) 25 MG tablet Take 25 mg by mouth daily.     No current  facility-administered medications on file prior to visit.          Objective:     Vitals:   11/26/20 1118  BP: (!) 156/84  Pulse: 76    Filed Weights   11/26/20 1118  Weight: 190 lb (86.2 kg)                Assessment:    No obstetric history on file. Patient Active Problem List   Diagnosis Date Noted  . Other insomnia 05/01/2020  . Essential hypertension 12/28/2019  . Dysplasia of cervix, low grade (CIN 1) 12/28/2019  . Carpal tunnel syndrome 05/30/2014     1. Low grade squamous intraepithelial lesion on cytologic smear of cervix (LGSIL)   2. High risk human papilloma virus (HPV) infection of cervix        Plan:            1.  We have discussed HPV in detail.  My overall impression is that patient has a low-grade abnormality of her cervix which is most likely remained stable over the last few years.  Of significant note she does have HPV type 16.  We spent our visit today discussing future work-up and possibilities based on Pap smears and colposcopies over the last 2 years.  I recommended another colposcopy.  The question then becomes if it is CIN-1 should we perform a LEEP to try to alleviate all dysplasia so that she does not have to continue to follow-up so frequently especially with colposcopies or should we continue to carefully monitor her cytology with the belief that it has been stable and will not progress.  The risk and benefits of both of these strategies were discussed in detail.  The added risk of HPV type 16 was discussed. She will schedule for a colposcopy and after those results return we will make a decision regarding future follow-ups. Orders No orders of the defined types were placed in this encounter.   No orders of the defined types were placed in this encounter.     F/U  Return in about 3 weeks (around 12/17/2020). I spent 22 minutes involved in the care of this patient preparing to see the patient by obtaining and reviewing her medical history (including labs, imaging tests and prior procedures), documenting clinical information in the electronic health record (EHR), counseling and  coordinating care plans, writing and sending prescriptions, ordering tests or procedures and directly communicating with the patient by discussing pertinent items from her history and physical exam as well as detailing my assessment and plan as noted above so that she has an informed understanding.  All of her questions were answered.  Elonda Husky, M.D. 11/26/2020 11:47 AM

## 2020-12-17 ENCOUNTER — Encounter: Payer: BC Managed Care – PPO | Admitting: Obstetrics and Gynecology

## 2020-12-18 ENCOUNTER — Encounter: Payer: Self-pay | Admitting: Obstetrics and Gynecology

## 2020-12-19 ENCOUNTER — Other Ambulatory Visit: Payer: Self-pay | Admitting: Internal Medicine

## 2020-12-19 DIAGNOSIS — G4709 Other insomnia: Secondary | ICD-10-CM

## 2020-12-20 NOTE — Telephone Encounter (Signed)
Requested medications are due for refill today yes  Requested medications are on the active medication list yes  Last refill 04/2020  Last visit 04/2020  Future visit scheduled Mar 2022  Notes to clinic Not Delegated

## 2021-03-06 ENCOUNTER — Ambulatory Visit: Payer: BC Managed Care – PPO | Admitting: Internal Medicine

## 2021-03-26 ENCOUNTER — Ambulatory Visit: Payer: BC Managed Care – PPO | Admitting: Internal Medicine

## 2021-03-26 ENCOUNTER — Encounter: Payer: Self-pay | Admitting: Internal Medicine

## 2021-03-26 ENCOUNTER — Other Ambulatory Visit: Payer: Self-pay

## 2021-03-26 VITALS — BP 122/68 | HR 72 | Temp 98.5°F | Ht 60.0 in | Wt 185.0 lb

## 2021-03-26 DIAGNOSIS — G4709 Other insomnia: Secondary | ICD-10-CM | POA: Diagnosis not present

## 2021-03-26 DIAGNOSIS — I10 Essential (primary) hypertension: Secondary | ICD-10-CM

## 2021-03-26 NOTE — Progress Notes (Signed)
Date:  03/26/2021   Name:  Chelsea [name]   DOB:  10-01-1956   MRN:  [id number]   Chief Complaint: Hypertension  Hypertension This is a chronic problem. The problem is controlled. Pertinent negatives include no chest pain, headaches, palpitations or shortness of breath. Past treatments include calcium channel blockers and diuretics (maxzide added last visit). The current treatment provides significant improvement.  Insomnia Primary symptoms: sleep disturbance.  The problem occurs intermittently.    Lab Results  Component Value Date   CREATININE 0.82 12/28/2019   BUN 15 12/28/2019   NA 142 12/28/2019   K 4.0 12/28/2019   CL 106 12/28/2019   CO2 24 12/28/2019   Lab Results  Component Value Date   CHOL 185 10/14/2020   HDL 56 10/14/2020   LDLCALC 112 (H) 10/14/2020   TRIG 94 10/14/2020   CHOLHDL 3.3 10/14/2020   Lab Results  Component Value Date   TSH 1.860 10/14/2020   Lab Results  Component Value Date   HGBA1C 5.4 10/14/2020   Lab Results  Component Value Date   WBC 5.8 11/04/2019   HGB 12.4 11/04/2019   HCT 39.9 11/04/2019   MCV 83.1 11/04/2019   PLT 261 11/04/2019   Lab Results  Component Value Date   ALT 16 11/04/2019   AST 21 11/04/2019   ALKPHOS 100 11/04/2019   BILITOT 0.3 11/04/2019     Review of Systems  Constitutional: Negative for fatigue and unexpected weight change.  HENT: Negative for nosebleeds.   Eyes: Negative for visual disturbance.  Respiratory: Negative for cough, chest tightness, shortness of breath and wheezing.   Cardiovascular: Negative for chest pain, palpitations and leg swelling.  Gastrointestinal: Negative for abdominal pain, constipation and diarrhea.  Neurological: Negative for dizziness, weakness, light-headedness and headaches.  Psychiatric/Behavioral: Positive for sleep disturbance. The patient has insomnia.     Patient Active Problem List   Diagnosis Date Noted  . Other insomnia 05/01/2020  . Essential  hypertension 12/28/2019  . Dysplasia of cervix, low grade (CIN 1) 12/28/2019  . Carpal tunnel syndrome 05/30/2014    No Known Allergies  Past Surgical History:  Procedure Laterality Date  . arm surgery    . BREAST BIOPSY Right 2015   papilloma  . BREAST EXCISIONAL BIOPSY Right 2015   neg  . CESAREAN SECTION    . COLONOSCOPY    . GANGLION CYST EXCISION Right   . KNEE ARTHROSCOPY WITH MEDIAL MENISECTOMY Left 04/22/2016   Procedure: KNEE ARTHROSCOPY WITH MEDIAL MENISECTOMY;  Surgeon: Kennedy Bucker, MD;  Location: [facility] ORS;  Service: Orthopedics;  Laterality: Left;  . TONSILLECTOMY    . TRIGGER FINGER RELEASE Right     Social History   Tobacco Use  . Smoking status: Never Smoker  . Smokeless tobacco: Never Used  Vaping Use  . Vaping Use: Never used  Substance Use Topics  . Alcohol use: No  . Drug use: No     Medication list has been reviewed and updated.  Current Meds  Medication Sig  . amLODipine (NORVASC) 10 MG tablet Take 1 tablet (10 mg total) by mouth daily.  . calcium carbonate (OS-CAL - DOSED IN MG OF ELEMENTAL CALCIUM) 1250 (500 Ca) MG tablet Take 1 tablet by mouth daily.  . cetirizine (ZYRTEC) 10 MG tablet Take 10 mg by mouth daily.  . Cholecalciferol (VITAMIN D3) 5000 units TABS Take 1 tablet by mouth daily.  . Multiple Vitamin (MULTIVITAMIN) tablet Take 1 tablet by mouth daily.  Marland Kitchen  Omega-3 Fatty Acids (FISH OIL PO) Take 1 tablet by mouth daily.  Marland Kitchen triamterene-hydrochlorothiazide (MAXZIDE-25) 37.5-25 MG tablet Take 1 tablet by mouth daily.  . zaleplon (SONATA) 10 MG capsule TAKE 1 CAPSULE (10 MG TOTAL) BY MOUTH AT BEDTIME AS NEEDED FOR SLEEP.    PHQ 2/9 Scores 03/26/2021 11/25/2020 05/01/2020 12/28/2019  PHQ - 2 Score 0 0 0 0  PHQ- 9 Score 0 0 2 0    GAD 7 : Generalized Anxiety Score 03/26/2021 11/25/2020 05/01/2020  Nervous, Anxious, on Edge 0 0 1  Control/stop worrying 0 0 1  Worry too much - different things 0 0 1  Trouble relaxing 0 0 1  Restless 0 0 1   Easily annoyed or irritable 0 0 0  Afraid - awful might happen 0 0 1  Total GAD 7 Score 0 0 6  Anxiety Difficulty - - Not difficult at all    BP Readings from Last 3 Encounters:  03/26/21 122/68  11/26/20 (!) 156/84  11/25/20 139/68    Physical Exam Vitals and nursing note reviewed.  Constitutional:      General: She is not in acute distress.    Appearance: She is well-developed.  HENT:     Head: Normocephalic and atraumatic.  Cardiovascular:     Rate and Rhythm: Normal rate and regular rhythm.     Pulses: Normal pulses.     Heart sounds: No murmur heard.   Pulmonary:     Effort: Pulmonary effort is normal. No respiratory distress.     Breath sounds: No wheezing or rhonchi.  Musculoskeletal:     Cervical back: Normal range of motion.     Right lower leg: No edema.     Left lower leg: No edema.  Lymphadenopathy:     Cervical: No cervical adenopathy.  Skin:    General: Skin is warm and dry.     Capillary Refill: Capillary refill takes less than 2 seconds.     Findings: No rash.  Neurological:     General: No focal deficit present.     Mental Status: She is alert and oriented to person, place, and time.  Psychiatric:        Mood and Affect: Mood normal.        Behavior: Behavior normal.     Wt Readings from Last 3 Encounters:  03/26/21 185 lb (83.9 kg)  11/26/20 190 lb (86.2 kg)  11/25/20 190 lb (86.2 kg)    BP 122/68   Pulse 72   Temp 98.5 F (36.9 C) (Oral)   Ht 5' (1.524 m)   Wt 185 lb (83.9 kg)   SpO2 98%   BMI 36.13 kg/m   Assessment and Plan: 1. Essential hypertension Clinically stable exam with well controlled BP with the addition of maxzide. Tolerating medications without side effects at this time. Pt to continue current regimen and low sodium diet; benefits of regular exercise as able discussed.  2. Other insomnia Continue Sonata PRN   Partially dictated using Animal nutritionist. Any errors are unintentional.  Bari Edward, MD [facility]  03/26/2021

## 2021-05-25 ENCOUNTER — Other Ambulatory Visit: Payer: Self-pay | Admitting: Internal Medicine

## 2021-05-25 DIAGNOSIS — I10 Essential (primary) hypertension: Secondary | ICD-10-CM

## 2021-06-09 ENCOUNTER — Other Ambulatory Visit: Payer: Self-pay

## 2021-06-09 ENCOUNTER — Ambulatory Visit
Admission: RE | Admit: 2021-06-09 | Discharge: 2021-06-09 | Disposition: A | Discharge status: Home / Self Care | Payer: BC Managed Care – PPO | Source: Ambulatory Visit | Attending: Obstetrics and Gynecology | Admitting: Obstetrics and Gynecology

## 2021-06-09 DIAGNOSIS — Z1231 Encounter for screening mammogram for malignant neoplasm of breast: Secondary | ICD-10-CM | POA: Insufficient documentation

## 2021-07-15 ENCOUNTER — Other Ambulatory Visit: Payer: Self-pay | Admitting: Internal Medicine

## 2021-07-15 DIAGNOSIS — I10 Essential (primary) hypertension: Secondary | ICD-10-CM

## 2021-07-15 NOTE — Telephone Encounter (Signed)
Requested Prescriptions  Pending Prescriptions Disp Refills  . amLODipine (NORVASC) 10 MG tablet [Pharmacy Med Name: AMLODIPINE BESYLATE 10 MG TAB] 90 tablet 0    Sig: TAKE 1 TABLET BY MOUTH EVERY DAY     Cardiovascular:  Calcium Channel Blockers Passed - 07/15/2021  1:28 AM      Passed - Last BP in normal range    BP Readings from Last 1 Encounters:  03/26/21 122/68         Passed - Valid encounter within last 6 months    Recent Outpatient Visits          3 months ago Essential hypertension   [facility] Reubin Milan, MD   7 months ago Essential hypertension   [facility] Reubin Milan, MD   1 year ago Essential hypertension   [facility] Reubin Milan, MD   1 year ago Need for hepatitis C screening test   [facility] Reubin Milan, MD      Future Appointments            In 3 months Logan Bores, Ellsworth Lennox, MD [facility]   In 3 months Judithann Graves Nyoka Cowden, MD [facility]

## 2021-07-22 ENCOUNTER — Other Ambulatory Visit: Payer: Self-pay | Admitting: Internal Medicine

## 2021-07-22 DIAGNOSIS — G4709 Other insomnia: Secondary | ICD-10-CM

## 2021-07-22 NOTE — Telephone Encounter (Signed)
Requested medications are due for refill today.  yes  Requested medications are on the active medications list.  yes  Last refill. 12/22/2020  Future visit scheduled.   yes  Notes to clinic.  Medication not delegated.

## 2021-07-23 NOTE — Telephone Encounter (Signed)
Please review. Last office visit is 03/26/2021.  KP

## 2021-10-16 ENCOUNTER — Other Ambulatory Visit: Payer: Self-pay | Admitting: Internal Medicine

## 2021-10-16 ENCOUNTER — Encounter: Payer: Self-pay | Admitting: Obstetrics and Gynecology

## 2021-10-16 DIAGNOSIS — I10 Essential (primary) hypertension: Secondary | ICD-10-CM

## 2021-10-16 NOTE — Telephone Encounter (Signed)
Requested Prescriptions  Pending Prescriptions Disp Refills  . amLODipine (NORVASC) 10 MG tablet [Pharmacy Med Name: AMLODIPINE BESYLATE 10 MG TAB] 30 tablet 0    Sig: TAKE 1 TABLET BY MOUTH EVERY DAY     Cardiovascular:  Calcium Channel Blockers Failed - 10/16/2021  1:45 AM      Failed - Valid encounter within last 6 months    Recent Outpatient Visits          6 months ago Essential hypertension   [facility] Reubin Milan, MD   10 months ago Essential hypertension   [facility] Reubin Milan, MD   1 year ago Essential hypertension   [facility] Reubin Milan, MD   1 year ago Need for hepatitis C screening test   [facility] Reubin Milan, MD      Future Appointments            In 5 days Reubin Milan, MD [facility]           Passed - Last BP in normal range    BP Readings from Last 1 Encounters:  03/26/21 122/68

## 2021-10-21 ENCOUNTER — Ambulatory Visit: Payer: BC Managed Care – PPO | Admitting: Internal Medicine

## 2021-10-26 ENCOUNTER — Encounter: Payer: Self-pay | Admitting: Internal Medicine

## 2021-10-26 ENCOUNTER — Ambulatory Visit (INDEPENDENT_AMBULATORY_CARE_PROVIDER_SITE_OTHER): Payer: 59 | Admitting: Internal Medicine

## 2021-10-26 ENCOUNTER — Other Ambulatory Visit: Payer: Self-pay

## 2021-10-26 VITALS — BP 134/58 | HR 74 | Ht 60.0 in | Wt 181.4 lb

## 2021-10-26 DIAGNOSIS — L0390 Cellulitis, unspecified: Secondary | ICD-10-CM | POA: Diagnosis not present

## 2021-10-26 DIAGNOSIS — G4709 Other insomnia: Secondary | ICD-10-CM

## 2021-10-26 DIAGNOSIS — I10 Essential (primary) hypertension: Secondary | ICD-10-CM

## 2021-10-26 DIAGNOSIS — Z23 Encounter for immunization: Secondary | ICD-10-CM | POA: Diagnosis not present

## 2021-10-26 MED ORDER — TRIAMTERENE-HCTZ 37.5-25 MG PO TABS: 1.0000 | ORAL_TABLET | Freq: Every day | ORAL | 1 refills | Status: DC

## 2021-10-26 MED ORDER — ZALEPLON 10 MG PO CAPS: 10.0000 mg | ORAL_CAPSULE | Freq: Every evening | ORAL | 2 refills | Status: DC | PRN

## 2021-10-26 MED ORDER — AMLODIPINE BESYLATE 10 MG PO TABS: 10.0000 mg | ORAL_TABLET | Freq: Every day | ORAL | 1 refills | Status: DC

## 2021-10-26 NOTE — Progress Notes (Signed)
Date:  10/26/2021   Name:  Chelsea [name]   DOB:  [birth year]   MRN:  [id number]   Chief Complaint: Hypertension  Hypertension This is a chronic problem. The problem is controlled. Pertinent negatives include no chest pain, headaches, palpitations or shortness of breath. Past treatments include diuretics and calcium channel blockers. The current treatment provides significant improvement. There is no history of kidney disease, CAD/MI or CVA.   Lab Results  Component Value Date   CREATININE 0.82 12/28/2019   BUN 15 12/28/2019   NA 142 12/28/2019   K 4.0 12/28/2019   CL 106 12/28/2019   CO2 24 12/28/2019   Lab Results  Component Value Date   CHOL 185 10/14/2020   HDL 56 10/14/2020   LDLCALC 112 (H) 10/14/2020   TRIG 94 10/14/2020   CHOLHDL 3.3 10/14/2020   Lab Results  Component Value Date   TSH 1.860 10/14/2020   Lab Results  Component Value Date   HGBA1C 5.4 10/14/2020   Lab Results  Component Value Date   WBC 5.8 11/04/2019   HGB 12.4 11/04/2019   HCT 39.9 11/04/2019   MCV 83.1 11/04/2019   PLT 261 11/04/2019   Lab Results  Component Value Date   ALT 16 11/04/2019   AST 21 11/04/2019   ALKPHOS 100 11/04/2019   BILITOT 0.3 11/04/2019     Review of Systems  Constitutional:  Positive for unexpected weight change (has lost about 10 lbs with exercise). Negative for chills and fatigue.  HENT:  Negative for nosebleeds.   Eyes:  Negative for visual disturbance.  Respiratory:  Negative for cough, chest tightness, shortness of breath and wheezing.   Cardiovascular:  Negative for chest pain, palpitations and leg swelling.  Gastrointestinal:  Negative for abdominal pain, constipation and diarrhea.  Skin:  Positive for color change (red spot on left breast).  Neurological:  Negative for dizziness, weakness, light-headedness and headaches.  Psychiatric/Behavioral:  Positive for sleep disturbance. Negative for dysphoric mood. The patient is not nervous/anxious.     Patient Active Problem List   Diagnosis Date Noted   Other insomnia 05/01/2020   Essential hypertension 12/28/2019   Dysplasia of cervix, low grade (CIN 1) 12/28/2019   Carpal tunnel syndrome 05/30/2014    No Known Allergies  Past Surgical History:  Procedure Laterality Date   arm surgery     BREAST BIOPSY Right 2015   papilloma   BREAST EXCISIONAL BIOPSY Right 2015   neg   CESAREAN SECTION     COLONOSCOPY     GANGLION CYST EXCISION Right    KNEE ARTHROSCOPY WITH MEDIAL MENISECTOMY Left 04/22/2016   Procedure: KNEE ARTHROSCOPY WITH MEDIAL MENISECTOMY;  Surgeon: Hessie Knows, MD;  Location: [facility] ORS;  Service: Orthopedics;  Laterality: Left;   TONSILLECTOMY     TRIGGER FINGER RELEASE Right     Social History   Tobacco Use   Smoking status: Never   Smokeless tobacco: Never  Vaping Use   Vaping Use: Never used  Substance Use Topics   Alcohol use: No   Drug use: No     Medication list has been reviewed and updated.  Current Meds  Medication Sig   amLODipine (NORVASC) 10 MG tablet TAKE 1 TABLET BY MOUTH EVERY DAY   calcium carbonate (OS-CAL - DOSED IN MG OF ELEMENTAL CALCIUM) 1250 (500 Ca) MG tablet Take 1 tablet by mouth daily.   cetirizine (ZYRTEC) 10 MG tablet Take 10 mg by mouth daily.   Cholecalciferol (VITAMIN  D3) 5000 units TABS Take 1 tablet by mouth daily.   Multiple Vitamin (MULTIVITAMIN) tablet Take 1 tablet by mouth daily.   Omega-3 Fatty Acids (FISH OIL PO) Take 1 tablet by mouth daily.   triamterene-hydrochlorothiazide (MAXZIDE-25) 37.5-25 MG tablet TAKE 1 TABLET BY MOUTH EVERY DAY   zaleplon (SONATA) 10 MG capsule TAKE 1 CAPSULE (10 MG TOTAL) BY MOUTH AT BEDTIME AS NEEDED FOR SLEEP.    PHQ 2/9 Scores 10/26/2021 03/26/2021 11/25/2020 05/01/2020  PHQ - 2 Score 0 0 0 0  PHQ- 9 Score 1 0 0 2    GAD 7 : Generalized Anxiety Score 10/26/2021 03/26/2021 11/25/2020 05/01/2020  Nervous, Anxious, on Edge 0 0 0 1  Control/stop worrying 0 0 0 1  Worry too much  - different things 0 0 0 1  Trouble relaxing 0 0 0 1  Restless 0 0 0 1  Easily annoyed or irritable 0 0 0 0  Afraid - awful might happen 0 0 0 1  Total GAD 7 Score 0 0 0 6  Anxiety Difficulty Not difficult at all - - Not difficult at all    BP Readings from Last 3 Encounters:  10/26/21 (!) 134/58  03/26/21 122/68  11/26/20 (!) 156/84    Physical Exam Vitals and nursing note reviewed.  Constitutional:      General: She is not in acute distress.    Appearance: She is well-developed.  HENT:     Head: Normocephalic and atraumatic.  Neck:     Vascular: No carotid bruit.  Cardiovascular:     Rate and Rhythm: Normal rate and regular rhythm.     Pulses: Normal pulses.     Heart sounds: No murmur heard. Pulmonary:     Effort: Pulmonary effort is normal. No respiratory distress.  Chest:       Comments: Raised firm non tender red lesion c/w subcut abscess. Musculoskeletal:     Cervical back: Normal range of motion.     Right lower leg: No edema.     Left lower leg: No edema.  Lymphadenopathy:     Cervical: No cervical adenopathy.  Skin:    General: Skin is warm and dry.     Findings: No rash.  Neurological:     Mental Status: She is alert and oriented to person, place, and time.  Psychiatric:        Mood and Affect: Mood normal.        Behavior: Behavior normal.    Wt Readings from Last 3 Encounters:  10/26/21 181 lb 6.4 oz (82.3 kg)  03/26/21 185 lb (83.9 kg)  11/26/20 190 lb (86.2 kg)    BP (!) 134/58   Pulse 74   Ht 5' (1.524 m)   Wt 181 lb 6.4 oz (82.3 kg)   SpO2 99%   BMI 35.43 kg/m   Assessment and Plan: 1. Essential hypertension Clinically stable exam with well controlled BP. Tolerating medications without side effects at this time. Pt to continue current regimen and low sodium diet; benefits of regular exercise as able discussed. - Comprehensive metabolic panel - triamterene-hydrochlorothiazide (MAXZIDE-25) 37.5-25 MG tablet; Take 1 tablet by mouth  daily.  Dispense: 90 tablet; Refill: 1 - amLODipine (NORVASC) 10 MG tablet; Take 1 tablet (10 mg total) by mouth daily.  Dispense: 90 tablet; Refill: 1 - TSH - CBC with Differential/Platelet  2. Other insomnia Doing well on sonata PRN - zaleplon (SONATA) 10 MG capsule; Take 1 capsule (10 mg total) by mouth at  bedtime as needed for sleep.  Dispense: 30 capsule; Refill: 2  3. Need for immunization against influenza - Flu Vaccine QUAD 61mo+IM (Fluarix, Fluzone & Alfiuria Quad PF)  4. Cellulitis, unspecified cellulitis site Continue warm compresses as needed Call for Abtx if purulent material drains   Partially dictated using Animal nutritionist. Any errors are unintentional.  Bari Edward, MD [facility]  10/26/2021

## 2021-10-27 LAB — COMPREHENSIVE METABOLIC PANEL
ALT: 16 IU/L (ref 0–32)
AST: 21 IU/L (ref 0–40)
Albumin/Globulin Ratio: 1.9 (ref 1.2–2.2)
Albumin: 4.7 g/dL (ref 3.8–4.8)
Alkaline Phosphatase: 136 IU/L — ABNORMAL HIGH (ref 44–121)
BUN/Creatinine Ratio: 14 (ref 12–28)
BUN: 12 mg/dL (ref 8–27)
Bilirubin Total: 0.4 mg/dL (ref 0.0–1.2)
CO2: 26 mmol/L (ref 20–29)
Calcium: 9.6 mg/dL (ref 8.7–10.3)
Chloride: 101 mmol/L (ref 96–106)
Creatinine, Ser: 0.88 mg/dL (ref 0.57–1.00)
Globulin, Total: 2.5 g/dL (ref 1.5–4.5)
Glucose: 93 mg/dL (ref 70–99)
Potassium: 4 mmol/L (ref 3.5–5.2)
Sodium: 140 mmol/L (ref 134–144)
Total Protein: 7.2 g/dL (ref 6.0–8.5)
eGFR: 73 mL/min/{1.73_m2} (ref >59)

## 2021-10-27 LAB — CBC WITH DIFFERENTIAL/PLATELET
Basophils Absolute: 0.1 10*3/uL (ref 0.0–0.2)
Basos: 1 %
EOS (ABSOLUTE): 0.1 10*3/uL (ref 0.0–0.4)
Eos: 1 %
Hematocrit: 39.3 % (ref 34.0–46.6)
Hemoglobin: 12 g/dL (ref 11.1–15.9)
Immature Grans (Abs): 0 10*3/uL (ref 0.0–0.1)
Immature Granulocytes: 0 %
Lymphocytes Absolute: 1.6 10*3/uL (ref 0.7–3.1)
Lymphs: 23 %
MCH: 25.3 pg — ABNORMAL LOW (ref 26.6–33.0)
MCHC: 30.5 g/dL — ABNORMAL LOW (ref 31.5–35.7)
MCV: 83 fL (ref 79–97)
Monocytes Absolute: 0.5 10*3/uL (ref 0.1–0.9)
Monocytes: 8 %
Neutrophils Absolute: 4.7 10*3/uL (ref 1.4–7.0)
Neutrophils: 67 %
Platelets: 330 10*3/uL (ref 150–450)
RBC: 4.75 x10E6/uL (ref 3.77–5.28)
RDW: 13.5 % (ref 11.7–15.4)
WBC: 6.9 10*3/uL (ref 3.4–10.8)

## 2021-10-27 LAB — TSH: TSH: 1.49 u[IU]/mL (ref 0.450–4.500)

## 2022-01-21 ENCOUNTER — Encounter: Payer: Self-pay | Admitting: Obstetrics and Gynecology

## 2022-01-21 DIAGNOSIS — Z1231 Encounter for screening mammogram for malignant neoplasm of breast: Secondary | ICD-10-CM

## 2022-01-21 DIAGNOSIS — Z01419 Encounter for gynecological examination (general) (routine) without abnormal findings: Secondary | ICD-10-CM

## 2022-03-09 ENCOUNTER — Ambulatory Visit (INDEPENDENT_AMBULATORY_CARE_PROVIDER_SITE_OTHER): Payer: 59 | Admitting: Obstetrics and Gynecology

## 2022-03-09 ENCOUNTER — Encounter: Payer: Self-pay | Admitting: Obstetrics and Gynecology

## 2022-03-09 ENCOUNTER — Other Ambulatory Visit: Payer: Self-pay

## 2022-03-09 ENCOUNTER — Other Ambulatory Visit (HOSPITAL_COMMUNITY)
Admission: RE | Admit: 2022-03-09 | Discharge: 2022-03-09 | Disposition: A | Discharge status: Home / Self Care | Payer: 59 | Source: Ambulatory Visit | Attending: Obstetrics and Gynecology | Admitting: Obstetrics and Gynecology

## 2022-03-09 VITALS — BP 147/76 | HR 61 | Ht 60.0 in | Wt 184.0 lb

## 2022-03-09 DIAGNOSIS — N72 Inflammatory disease of cervix uteri: Secondary | ICD-10-CM | POA: Diagnosis not present

## 2022-03-09 DIAGNOSIS — Z01419 Encounter for gynecological examination (general) (routine) without abnormal findings: Secondary | ICD-10-CM | POA: Insufficient documentation

## 2022-03-09 DIAGNOSIS — B977 Papillomavirus as the cause of diseases classified elsewhere: Secondary | ICD-10-CM

## 2022-03-09 DIAGNOSIS — R87612 Low grade squamous intraepithelial lesion on cytologic smear of cervix (LGSIL): Secondary | ICD-10-CM

## 2022-03-09 DIAGNOSIS — R69 Illness, unspecified: Secondary | ICD-10-CM | POA: Diagnosis not present

## 2022-03-09 DIAGNOSIS — Z1231 Encounter for screening mammogram for malignant neoplasm of breast: Secondary | ICD-10-CM | POA: Diagnosis not present

## 2022-03-09 DIAGNOSIS — Z124 Encounter for screening for malignant neoplasm of cervix: Secondary | ICD-10-CM | POA: Diagnosis not present

## 2022-03-09 NOTE — Progress Notes (Signed)
HPI: ?     Chelsea [name] is a 66 y.o. [phone] who LMP was No LMP recorded. Patient is postmenopausal. ? ?Subjective:  ? ?She presents today for her annual examination.  She says she is generally doing well.  She was scheduled to come back for colposcopy more than a year ago after abnormal Pap smear but she says she does not know how she could possibly have forgotten this.  She has a history of CIN-1 with positive HPV type 16.  This has been stable for several years. ?She does state that she occasionally has some anal incontinence but it is very small amounts and does not get in her underwear.  She says she is not trying to get it fixed just wondering if this is common. ? ?  Hx: ?The following portions of the patient's history were reviewed and updated as appropriate: ?            She  has a past medical history of Diverticulosis and Hypertension. ?She does not have any pertinent problems on file. ?She  has a past surgical history that includes arm surgery; Cesarean section; Ganglion cyst excision (Right); Trigger finger release (Right); Colonoscopy; Tonsillectomy; Knee arthroscopy with medial menisectomy (Left, 04/22/2016); Breast biopsy (Right, 2015); and Breast excisional biopsy (Right, 2015). ?Her family history includes Breast cancer in her mother; Diabetes in her father; Heart attack (age of onset: 4161) in her mother; Kidney disease in her father. ?She  reports that she has never smoked. She has never used smokeless tobacco. She reports that she does not drink alcohol and does not use drugs. ?She has a current medication list which includes the following prescription(s): amlodipine, cetirizine, vitamin d3, multivitamin, omega-3 fatty acids, triamterene-hydrochlorothiazide, zaleplon, and [DISCONTINUED] hydrochlorothiazide. ?She has No Known Allergies. ?      ?Review of Systems:  ?Review of Systems ? ?Constitutional: Denied constitutional symptoms, night sweats, recent illness, fatigue, fever, insomnia and  weight loss.  ?Eyes: Denied eye symptoms, eye pain, photophobia, vision change and visual disturbance.  ?Ears/Nose/Throat/Neck: Denied ear, nose, throat or neck symptoms, hearing loss, nasal discharge, sinus congestion and sore throat.  ?Cardiovascular: Denied cardiovascular symptoms, arrhythmia, chest pain/pressure, edema, exercise intolerance, orthopnea and palpitations.  ?Respiratory: Denied pulmonary symptoms, asthma, pleuritic pain, productive sputum, cough, dyspnea and wheezing.  ?Gastrointestinal: Denied, gastro-esophageal reflux, melena, nausea and vomiting.  ?Genitourinary: See HPI for additional information.  ?Musculoskeletal: Denied musculoskeletal symptoms, stiffness, swelling, muscle weakness and myalgia.  ?Dermatologic: Denied dermatology symptoms, rash and scar.  ?Neurologic: Denied neurology symptoms, dizziness, headache, neck pain and syncope.  ?Psychiatric: Denied psychiatric symptoms, anxiety and depression.  ?Endocrine: Denied endocrine symptoms including hot flashes and night sweats.  ? ?Meds: ?  ?Current Outpatient Medications on File Prior to Visit  ?Medication Sig Dispense Refill  ? amLODipine (NORVASC) 10 MG tablet Take 1 tablet (10 mg total) by mouth daily. 90 tablet 1  ? cetirizine (ZYRTEC) 10 MG tablet Take 10 mg by mouth daily.    ? Cholecalciferol (VITAMIN D3) 5000 units TABS Take 1 tablet by mouth daily.    ? Multiple Vitamin (MULTIVITAMIN) tablet Take 1 tablet by mouth daily.    ? Omega-3 Fatty Acids (FISH OIL PO) Take 1 tablet by mouth daily.    ? triamterene-hydrochlorothiazide (MAXZIDE-25) 37.5-25 MG tablet Take 1 tablet by mouth daily. 90 tablet 1  ? zaleplon (SONATA) 10 MG capsule Take 1 capsule (10 mg total) by mouth at bedtime as needed for sleep. 30 capsule 2  ? [  DISCONTINUED] hydrochlorothiazide (HYDRODIURIL) 25 MG tablet Take 25 mg by mouth daily.    ? ?No current facility-administered medications on file prior to visit.  ? ? ? ?Objective:  ?  ? ?Vitals:  ? 03/09/22 0815   ?BP: (!) 147/76  ?Pulse: 61  ?  ?Filed Weights  ? 03/09/22 0815  ?Weight: 184 lb (83.5 kg)  ? ?  ?         Physical examination ?General NAD, Conversant  ?HEENT Atraumatic; Op clear with mmm.  Normo-cephalic. Pupils reactive. Anicteric sclerae  ?Thyroid/Neck Smooth without nodularity or enlargement. Normal ROM.  Neck Supple.  ?Skin No rashes, lesions or ulceration. Normal palpated skin turgor. No nodularity.  ?Breasts: No masses or discharge.  Symmetric.  No axillary adenopathy.  ?Lungs: Clear to auscultation.No rales or wheezes. Normal Respiratory effort, no retractions.  ?Heart: NSR.  No murmurs or rubs appreciated. No periferal edema  ?Abdomen: Soft.  Non-tender.  No masses.  No HSM. No hernia  ?Extremities: Moves all appropriately.  Normal ROM for age. No lymphadenopathy.  ?Neuro: Oriented to PPT.  Normal mood. Normal affect.  ? ?  Pelvic:   ?Vulva: Normal appearance.  No lesions.  ?Vagina: No lesions or abnormalities noted.  ?Support: Normal pelvic support.  ?Urethra No masses tenderness or scarring.  ?Meatus Normal size without lesions or prolapse.  ?Cervix: Normal appearance.  No lesions.  Moderate cervical stenosis  ?Anus: Normal exam.  No lesions.  ?Perineum: Normal exam.  No lesions.  ?      Bimanual   ?Uterus: Normal size.  Non-tender.  Mobile.  AV.  ?Adnexae: No masses.  Non-tender to palpation.  ?Cul-de-sac: Negative for abnormality.  ? ? ?Assessment:  ?  ?[id number] ?Patient Active Problem List  ? Diagnosis Date Noted  ? Other insomnia 05/01/2020  ? Essential hypertension 12/28/2019  ? Dysplasia of cervix, low grade (CIN 1) 12/28/2019  ? Carpal tunnel syndrome 05/30/2014  ? ?  ?1. Well woman exam with routine gynecological exam   ?2. High risk human papilloma virus (HPV) infection of cervix   ?3. Low grade squamous intraepithelial lesion on cytologic smear of cervix (LGSIL)   ?4. Encounter for screening mammogram for malignant neoplasm of breast   ?5. Cervical cancer screening   ? ?  ? ? ?Plan:  ?  ?        ? 1.  Basic Screening Recommendations ?The basic screening recommendations for asymptomatic women were discussed with the patient during her visit.  The age-appropriate recommendations were discussed with her and the rational for the tests reviewed.  When I am informed by the patient that another primary care physician has previously obtained the age-appropriate tests and they are up-to-date, only outstanding tests are ordered and referrals given as necessary.  Abnormal results of tests will be discussed with her when all of her results are completed.  Routine preventative health maintenance measures emphasized: Exercise/Diet/Weight control, Tobacco Warnings, Alcohol/Substance use risks and Stress Management ?Pap performed -lab work ordered -mammogram ordered ?2.  We discussed anal incontinence-encouraged patient to keep stools firm. ?3.  Discussed abnormal Pap and need for colposcopy should this occur today. ? ?Orders ?Orders Placed This Encounter  ?Procedures  ? MM DIGITAL SCREENING BILATERAL  ? Basic metabolic panel  ? CBC  ? Lipid panel  ? Hemoglobin A1c  ? TSH  ? ? No orders of the defined types were placed in this encounter. ?   ?  ?  F/U ? No follow-ups on file. ? ?  Elonda Husky, M.D. ?03/09/2022 ?8:41 AM ? ? ?

## 2022-03-09 NOTE — Progress Notes (Signed)
Patients presents for annual exam today. She states she has been doing good. Patients last pap smear was 10/14/20, up to date. Patient is due for mammogram, ordered. Patients annual labs are ordered.Patient states no other questions or concerns at this time.  ? ?

## 2022-03-10 LAB — LIPID PANEL
Chol/HDL Ratio: 3.6 ratio (ref 0.0–4.4)
Cholesterol, Total: 180 mg/dL (ref 100–199)
HDL: 50 mg/dL (ref >39)
LDL Chol Calc (NIH): 105 mg/dL — ABNORMAL HIGH (ref 0–99)
Triglycerides: 141 mg/dL (ref 0–149)
VLDL Cholesterol Cal: 25 mg/dL (ref 5–40)

## 2022-03-10 LAB — CBC
Hematocrit: 36.6 % (ref 34.0–46.6)
Hemoglobin: 11.3 g/dL (ref 11.1–15.9)
MCH: 25.2 pg — ABNORMAL LOW (ref 26.6–33.0)
MCHC: 30.9 g/dL — ABNORMAL LOW (ref 31.5–35.7)
MCV: 82 fL (ref 79–97)
Platelets: 320 10*3/uL (ref 150–450)
RBC: 4.48 x10E6/uL (ref 3.77–5.28)
RDW: 14.4 % (ref 11.7–15.4)
WBC: 6.3 10*3/uL (ref 3.4–10.8)

## 2022-03-10 LAB — HEMOGLOBIN A1C
Est. average glucose Bld gHb Est-mCnc: 108 mg/dL
Hgb A1c MFr Bld: 5.4 % (ref 4.8–5.6)

## 2022-03-10 LAB — BASIC METABOLIC PANEL
BUN/Creatinine Ratio: 14 (ref 12–28)
BUN: 12 mg/dL (ref 8–27)
CO2: 26 mmol/L (ref 20–29)
Calcium: 9.1 mg/dL (ref 8.7–10.3)
Chloride: 99 mmol/L (ref 96–106)
Creatinine, Ser: 0.85 mg/dL (ref 0.57–1.00)
Glucose: 92 mg/dL (ref 70–99)
Potassium: 3.9 mmol/L (ref 3.5–5.2)
Sodium: 136 mmol/L (ref 134–144)
eGFR: 76 mL/min/{1.73_m2} (ref >59)

## 2022-03-10 LAB — TSH: TSH: 1.65 u[IU]/mL (ref 0.450–4.500)

## 2022-03-11 LAB — CYTOLOGY - PAP
Adequacy: ABSENT
Comment: NEGATIVE
High risk HPV: POSITIVE — AB

## 2022-03-17 NOTE — Progress Notes (Signed)
Keryn: ?Based on this Pap smear you need to have another colposcopy done.  It is no worse than before but unfortunately we have to determine the correct diagnosis by colposcopy.

## 2022-03-17 NOTE — Progress Notes (Signed)
Forwarded recommendation and advised patient to call in and schedule ?

## 2022-04-26 ENCOUNTER — Encounter: Payer: 59 | Admitting: Internal Medicine

## 2022-04-26 NOTE — Progress Notes (Deleted)
Date:  04/26/2022   Name:  Chelsea [name]   DOB:  04-01-1956   MRN:  [id number]   Chief Complaint: No chief complaint on file. Chelsea [name] is a 66 y.o. female who presents today for her Complete Annual Exam. She feels {DESC; WELL/FAIRLY WELL/POORLY:18703}. She reports exercising ***. She reports she is sleeping {DESC; WELL/FAIRLY WELL/POORLY:18703}. Breast complaints ***.  Mammogram: 05/2021 scheduled 06/10/22 DEXA: none Pap smear: 02/2022  LGSIL @ GYN Colonoscopy: 08/2020  Health Maintenance Due  Topic Date Due   Pneumococcal Vaccine 43-10 Years old (1 - PCV) Never done   HIV Screening  Never done   TETANUS/TDAP  Never done   Zoster Vaccines- Shingrix (1 of 2) Never done   COVID-19 Vaccine (3 - Booster for Moderna series) 10/09/2020    Immunization History  Administered Date(s) Administered   Influenza,inj,Quad PF,6+ Mos 10/09/2019, 10/14/2020, 10/26/2021   Influenza-Unspecified 09/09/2016, 09/28/2017, 09/26/2018   Moderna Sars-Covid-2 Vaccination 07/17/2020, 08/14/2020    Hypertension This is a chronic problem. The problem is controlled. Pertinent negatives include no chest pain, headaches, palpitations or shortness of breath. Past treatments include calcium channel blockers and diuretics. The current treatment provides significant improvement.  Insomnia Primary symptoms: no sleep disturbance.   The problem occurs nightly. Past treatments include medication. The treatment provided moderate relief.   Lab Results  Component Value Date   NA 136 03/09/2022   K 3.9 03/09/2022   CO2 26 03/09/2022   GLUCOSE 92 03/09/2022   BUN 12 03/09/2022   CREATININE 0.85 03/09/2022   CALCIUM 9.1 03/09/2022   EGFR 76 03/09/2022   GFRNONAA 77 12/28/2019   Lab Results  Component Value Date   CHOL 180 03/09/2022   HDL 50 03/09/2022   LDLCALC 105 (H) 03/09/2022   TRIG 141 03/09/2022   CHOLHDL 3.6 03/09/2022   Lab Results  Component Value Date   TSH 1.650 03/09/2022   Lab  Results  Component Value Date   HGBA1C 5.4 03/09/2022   Lab Results  Component Value Date   WBC 6.3 03/09/2022   HGB 11.3 03/09/2022   HCT 36.6 03/09/2022   MCV 82 03/09/2022   PLT 320 03/09/2022   Lab Results  Component Value Date   ALT 16 10/26/2021   AST 21 10/26/2021   ALKPHOS 136 (H) 10/26/2021   BILITOT 0.4 10/26/2021   No results found for: 25OHVITD2, 25OHVITD3, VD25OH   Review of Systems  Constitutional:  Negative for chills, fatigue and fever.  HENT:  Negative for congestion, hearing loss, tinnitus, trouble swallowing and voice change.   Eyes:  Negative for visual disturbance.  Respiratory:  Negative for cough, chest tightness, shortness of breath and wheezing.   Cardiovascular:  Negative for chest pain, palpitations and leg swelling.  Gastrointestinal:  Negative for abdominal pain, constipation, diarrhea and vomiting.  Endocrine: Negative for polydipsia and polyuria.  Genitourinary:  Negative for dysuria, frequency, genital sores, vaginal bleeding and vaginal discharge.  Musculoskeletal:  Negative for arthralgias, gait problem and joint swelling.  Skin:  Negative for color change and rash.  Neurological:  Negative for dizziness, tremors, light-headedness and headaches.  Hematological:  Negative for adenopathy. Does not bruise/bleed easily.  Psychiatric/Behavioral:  Negative for dysphoric mood and sleep disturbance. The patient has insomnia. The patient is not nervous/anxious.    Patient Active Problem List   Diagnosis Date Noted   Other insomnia 05/01/2020   Essential hypertension 12/28/2019   Dysplasia of cervix, low grade (CIN 1) 12/28/2019   Carpal  tunnel syndrome 05/30/2014    No Known Allergies  Past Surgical History:  Procedure Laterality Date   arm surgery     BREAST BIOPSY Right 2015   papilloma   BREAST EXCISIONAL BIOPSY Right 2015   neg   CESAREAN SECTION     COLONOSCOPY     GANGLION CYST EXCISION Right    KNEE ARTHROSCOPY WITH MEDIAL  MENISECTOMY Left 04/22/2016   Procedure: KNEE ARTHROSCOPY WITH MEDIAL MENISECTOMY;  Surgeon: Hessie Knows, MD;  Location: [facility] ORS;  Service: Orthopedics;  Laterality: Left;   TONSILLECTOMY     TRIGGER FINGER RELEASE Right     Social History   Tobacco Use   Smoking status: Never   Smokeless tobacco: Never  Vaping Use   Vaping Use: Never used  Substance Use Topics   Alcohol use: No   Drug use: No     Medication list has been reviewed and updated.  No outpatient medications have been marked as taking for the 04/26/22 encounter (Appointment) with Glean Hess, MD.       10/26/2021    8:24 AM 03/26/2021    8:49 AM 11/25/2020    2:45 PM 05/01/2020    9:30 AM  GAD 7 : Generalized Anxiety Score  Nervous, Anxious, on Edge 0 0 0 1  Control/stop worrying 0 0 0 1  Worry too much - different things 0 0 0 1  Trouble relaxing 0 0 0 1  Restless 0 0 0 1  Easily annoyed or irritable 0 0 0 0  Afraid - awful might happen 0 0 0 1  Total GAD 7 Score 0 0 0 6  Anxiety Difficulty Not difficult at all   Not difficult at all       10/26/2021    8:24 AM  Depression screen PHQ 2/9  Decreased Interest 0  Down, Depressed, Hopeless 0  PHQ - 2 Score 0  Altered sleeping 0  Tired, decreased energy 1  Change in appetite 0  Feeling bad or failure about yourself  0  Trouble concentrating 0  Moving slowly or fidgety/restless 0  Suicidal thoughts 0  PHQ-9 Score 1  Difficult doing work/chores Not difficult at all    BP Readings from Last 3 Encounters:  03/09/22 (!) 147/76  10/26/21 (!) 134/58  03/26/21 122/68    Physical Exam Vitals and nursing note reviewed.  Constitutional:      General: She is not in acute distress.    Appearance: She is well-developed.  HENT:     Head: Normocephalic and atraumatic.     Right Ear: Tympanic membrane and ear canal normal.     Left Ear: Tympanic membrane and ear canal normal.     Nose:     Right Sinus: No maxillary sinus tenderness.     Left  Sinus: No maxillary sinus tenderness.  Eyes:     General: No scleral icterus.       Right eye: No discharge.        Left eye: No discharge.     Conjunctiva/sclera: Conjunctivae normal.  Neck:     Thyroid: No thyromegaly.     Vascular: No carotid bruit.  Cardiovascular:     Rate and Rhythm: Normal rate and regular rhythm.     Pulses: Normal pulses.     Heart sounds: Normal heart sounds.  Pulmonary:     Effort: Pulmonary effort is normal. No respiratory distress.     Breath sounds: No wheezing.  Chest:  Breasts:  Right: No mass, nipple discharge, skin change or tenderness.     Left: No mass, nipple discharge, skin change or tenderness.  Abdominal:     General: Bowel sounds are normal.     Palpations: Abdomen is soft.     Tenderness: There is no abdominal tenderness.  Musculoskeletal:     Cervical back: Normal range of motion. No erythema.     Right lower leg: No edema.     Left lower leg: No edema.  Lymphadenopathy:     Cervical: No cervical adenopathy.  Skin:    General: Skin is warm and dry.     Findings: No rash.  Neurological:     Mental Status: She is alert and oriented to person, place, and time.     Cranial Nerves: No cranial nerve deficit.     Sensory: No sensory deficit.     Deep Tendon Reflexes: Reflexes are normal and symmetric.  Psychiatric:        Attention and Perception: Attention normal.        Mood and Affect: Mood normal.    Wt Readings from Last 3 Encounters:  03/09/22 184 lb (83.5 kg)  10/26/21 181 lb 6.4 oz (82.3 kg)  03/26/21 185 lb (83.9 kg)    There were no vitals taken for this visit.  Assessment and Plan:

## 2022-05-14 ENCOUNTER — Encounter: Payer: 59 | Admitting: Obstetrics and Gynecology

## 2022-05-27 ENCOUNTER — Ambulatory Visit (INDEPENDENT_AMBULATORY_CARE_PROVIDER_SITE_OTHER): Payer: 59 | Admitting: Internal Medicine

## 2022-05-27 ENCOUNTER — Encounter: Payer: Self-pay | Admitting: Internal Medicine

## 2022-05-27 VITALS — BP 128/62 | HR 67 | Ht 60.0 in | Wt 182.0 lb

## 2022-05-27 DIAGNOSIS — I10 Essential (primary) hypertension: Secondary | ICD-10-CM

## 2022-05-27 DIAGNOSIS — E785 Hyperlipidemia, unspecified: Secondary | ICD-10-CM | POA: Diagnosis not present

## 2022-05-27 DIAGNOSIS — Z0000 Encounter for general adult medical examination without abnormal findings: Secondary | ICD-10-CM

## 2022-05-27 DIAGNOSIS — Z23 Encounter for immunization: Secondary | ICD-10-CM

## 2022-05-27 DIAGNOSIS — Z1231 Encounter for screening mammogram for malignant neoplasm of breast: Secondary | ICD-10-CM | POA: Diagnosis not present

## 2022-05-27 DIAGNOSIS — N870 Mild cervical dysplasia: Secondary | ICD-10-CM | POA: Diagnosis not present

## 2022-05-27 LAB — POCT URINALYSIS DIPSTICK
Bilirubin, UA: NEGATIVE
Blood, UA: NEGATIVE
Glucose, UA: NEGATIVE
Ketones, UA: NEGATIVE
Leukocytes, UA: NEGATIVE
Nitrite, UA: NEGATIVE
Protein, UA: NEGATIVE
Spec Grav, UA: 1.01 (ref 1.010–1.025)
Urobilinogen, UA: 0.2 E.U./dL
pH, UA: 7 (ref 5.0–8.0)

## 2022-05-27 MED ORDER — TRIAMTERENE-HCTZ 37.5-25 MG PO TABS: 1.0000 | ORAL_TABLET | Freq: Every day | ORAL | 1 refills | Status: DC

## 2022-05-27 MED ORDER — AMLODIPINE BESYLATE 10 MG PO TABS: 10.0000 mg | ORAL_TABLET | Freq: Every day | ORAL | 1 refills | Status: DC

## 2022-05-27 NOTE — Progress Notes (Signed)
Date:  05/27/2022   Name:  Chelsea [name]   DOB:  Jun 09, 1956   MRN:  [id number]   Chief Complaint: Annual Exam (No Breast exam no pap) Chelsea [name] is a 66 y.o. female who presents today for her Complete Annual Exam. She feels well. She reports exercising walking daily. She reports she is sleeping well. Breast complaints none.  Mammogram: 05/2021 - scheduled DEXA: none Pap smear: 02/2022  LSIL with HPV - Colposcopy planned Colonoscopy: 08/2020  Health Maintenance Due  Topic Date Due   Pneumococcal Vaccine 9-62 Years old (1 - PCV) Never done   TETANUS/TDAP  Never done   Zoster Vaccines- Shingrix (1 of 2) Never done    Immunization History  Administered Date(s) Administered   Influenza,inj,Quad PF,6+ Mos 10/09/2019, 10/14/2020, 10/26/2021   Influenza-Unspecified 09/09/2016, 09/28/2017, 09/26/2018   Moderna Sars-Covid-2 Vaccination 07/17/2020, 08/14/2020    Hypertension This is a chronic problem. The problem is controlled. Pertinent negatives include no chest pain, headaches, palpitations or shortness of breath. Past treatments include calcium channel blockers and diuretics. The current treatment provides significant improvement.  Insomnia Primary symptoms: no sleep disturbance, frequent awakening.   The problem occurs intermittently. The problem is unchanged. Past treatments include medication. The treatment provided significant relief.    Lab Results  Component Value Date   NA 136 03/09/2022   K 3.9 03/09/2022   CO2 26 03/09/2022   GLUCOSE 92 03/09/2022   BUN 12 03/09/2022   CREATININE 0.85 03/09/2022   CALCIUM 9.1 03/09/2022   EGFR 76 03/09/2022   GFRNONAA 77 12/28/2019   Lab Results  Component Value Date   CHOL 180 03/09/2022   HDL 50 03/09/2022   LDLCALC 105 (H) 03/09/2022   TRIG 141 03/09/2022   CHOLHDL 3.6 03/09/2022   Lab Results  Component Value Date   TSH 1.650 03/09/2022   Lab Results  Component Value Date   HGBA1C 5.4 03/09/2022   Lab Results   Component Value Date   WBC 6.3 03/09/2022   HGB 11.3 03/09/2022   HCT 36.6 03/09/2022   MCV 82 03/09/2022   PLT 320 03/09/2022   Lab Results  Component Value Date   ALT 16 10/26/2021   AST 21 10/26/2021   ALKPHOS 136 (H) 10/26/2021   BILITOT 0.4 10/26/2021   No results found for: "25OHVITD2", "25OHVITD3", "VD25OH"   Review of Systems  Constitutional:  Negative for chills, fatigue and fever.  HENT:  Negative for congestion, hearing loss, tinnitus, trouble swallowing and voice change.   Eyes:  Negative for visual disturbance.  Respiratory:  Negative for cough, chest tightness, shortness of breath and wheezing.   Cardiovascular:  Negative for chest pain, palpitations and leg swelling.  Gastrointestinal:  Negative for abdominal pain, constipation, diarrhea and vomiting.       Occasional fecal smearing after bowel movement  Endocrine: Negative for polydipsia and polyuria.  Genitourinary:  Negative for dysuria, frequency, genital sores, vaginal bleeding and vaginal discharge.  Musculoskeletal:  Negative for arthralgias, gait problem and joint swelling.  Skin:  Negative for color change and rash.  Neurological:  Negative for dizziness, tremors, light-headedness and headaches.  Hematological:  Negative for adenopathy. Does not bruise/bleed easily.  Psychiatric/Behavioral:  Negative for dysphoric mood and sleep disturbance. The patient has insomnia. The patient is not nervous/anxious.     Patient Active Problem List   Diagnosis Date Noted   Other insomnia 05/01/2020   Essential hypertension 12/28/2019   Dysplasia of cervix, low grade (CIN 1)  12/28/2019   Carpal tunnel syndrome 05/30/2014    No Known Allergies  Past Surgical History:  Procedure Laterality Date   arm surgery     BREAST BIOPSY Right 2015   papilloma   BREAST EXCISIONAL BIOPSY Right 2015   neg   CESAREAN SECTION     COLONOSCOPY     GANGLION CYST EXCISION Right    KNEE ARTHROSCOPY WITH MEDIAL MENISECTOMY Left  04/22/2016   Procedure: KNEE ARTHROSCOPY WITH MEDIAL MENISECTOMY;  Surgeon: Hessie Knows, MD;  Location: [facility] ORS;  Service: Orthopedics;  Laterality: Left;   TONSILLECTOMY     TRIGGER FINGER RELEASE Right     Social History   Tobacco Use   Smoking status: Never   Smokeless tobacco: Never  Vaping Use   Vaping Use: Never used  Substance Use Topics   Alcohol use: No   Drug use: No     Medication list has been reviewed and updated.  Current Meds  Medication Sig   cetirizine (ZYRTEC) 10 MG tablet Take 10 mg by mouth daily.   Cholecalciferol (VITAMIN D3) 5000 units TABS Take 1 tablet by mouth daily.   Multiple Vitamin (MULTIVITAMIN) tablet Take 1 tablet by mouth daily.   Omega-3 Fatty Acids (FISH OIL PO) Take 1 tablet by mouth daily.   zaleplon (SONATA) 10 MG capsule Take 1 capsule (10 mg total) by mouth at bedtime as needed for sleep.   [DISCONTINUED] amLODipine (NORVASC) 10 MG tablet Take 1 tablet (10 mg total) by mouth daily.   [DISCONTINUED] triamterene-hydrochlorothiazide (MAXZIDE-25) 37.5-25 MG tablet Take 1 tablet by mouth daily.       05/27/2022    8:28 AM 10/26/2021    8:24 AM 03/26/2021    8:49 AM 11/25/2020    2:45 PM  GAD 7 : Generalized Anxiety Score  Nervous, Anxious, on Edge 0 0 0 0  Control/stop worrying 0 0 0 0  Worry too much - different things 0 0 0 0  Trouble relaxing 0 0 0 0  Restless 0 0 0 0  Easily annoyed or irritable 0 0 0 0  Afraid - awful might happen 0 0 0 0  Total GAD 7 Score 0 0 0 0  Anxiety Difficulty Not difficult at all Not difficult at all         05/27/2022    8:28 AM  Depression screen PHQ 2/9  Decreased Interest 0  Down, Depressed, Hopeless 0  PHQ - 2 Score 0  Altered sleeping 0  Tired, decreased energy 0  Change in appetite 0  Feeling bad or failure about yourself  0  Trouble concentrating 0  Moving slowly or fidgety/restless 0  Suicidal thoughts 0  PHQ-9 Score 0  Difficult doing work/chores Not difficult at all    BP  Readings from Last 3 Encounters:  05/27/22 128/62  03/09/22 (!) 147/76  10/26/21 (!) 134/58    Physical Exam Vitals and nursing note reviewed.  Constitutional:      General: She is not in acute distress.    Appearance: She is well-developed.  HENT:     Head: Normocephalic and atraumatic.     Right Ear: Tympanic membrane and ear canal normal.     Left Ear: Tympanic membrane and ear canal normal.     Nose:     Right Sinus: No maxillary sinus tenderness.     Left Sinus: No maxillary sinus tenderness.  Eyes:     General: No scleral icterus.       Right  eye: No discharge.        Left eye: No discharge.     Conjunctiva/sclera: Conjunctivae normal.  Neck:     Thyroid: No thyromegaly.     Vascular: No carotid bruit.  Cardiovascular:     Rate and Rhythm: Normal rate and regular rhythm.     Pulses: Normal pulses.     Heart sounds: Normal heart sounds.  Pulmonary:     Effort: Pulmonary effort is normal. No respiratory distress.     Breath sounds: No wheezing.  Abdominal:     General: Bowel sounds are normal.     Palpations: Abdomen is soft.     Tenderness: There is no abdominal tenderness.  Musculoskeletal:     Cervical back: Normal range of motion. No erythema.     Right lower leg: No edema.     Left lower leg: No edema.  Lymphadenopathy:     Cervical: No cervical adenopathy.  Skin:    General: Skin is warm and dry.     Findings: No rash.  Neurological:     Mental Status: She is alert and oriented to person, place, and time.     Cranial Nerves: No cranial nerve deficit.     Sensory: No sensory deficit.     Deep Tendon Reflexes: Reflexes are normal and symmetric.  Psychiatric:        Attention and Perception: Attention normal.        Mood and Affect: Mood normal.     Wt Readings from Last 3 Encounters:  05/27/22 182 lb (82.6 kg)  03/09/22 184 lb (83.5 kg)  10/26/21 181 lb 6.4 oz (82.3 kg)    BP 128/62   Pulse 67   Ht 5' (1.524 m)   Wt 182 lb (82.6 kg)   SpO2  98%   BMI 35.54 kg/m   Assessment and Plan: 1. Annual physical exam Exam is normal except for weight. Encourage regular exercise and appropriate dietary changes. Declines Shingles vaccine.  2. Encounter for screening mammogram for breast cancer scheduled  3. Essential hypertension Clinically stable exam with well controlled BP. Tolerating medications without side effects at this time. Pt to continue current regimen and low sodium diet; benefits of regular exercise as able discussed. - Comprehensive metabolic panel - POCT urinalysis dipstick - triamterene-hydrochlorothiazide (MAXZIDE-25) 37.5-25 MG tablet; Take 1 tablet by mouth daily.  Dispense: 90 tablet; Refill: 1 - amLODipine (NORVASC) 10 MG tablet; Take 1 tablet (10 mg total) by mouth daily.  Dispense: 90 tablet; Refill: 1  4. Mild hyperlipidemia Recent labs were stable. Continue low fat diet  5. Need for diphtheria-tetanus-pertussis (Tdap) vaccine - Tdap vaccine greater than or equal to 7yo IM  6. Dysplasia of cervix, low grade (CIN 1) Follow up with Dr. Ellard Artis for Colposcopy   Partially dictated using Dragon software. Any errors are unintentional.  Halina Maidens, MD [facility]  05/27/2022

## 2022-05-28 LAB — COMPREHENSIVE METABOLIC PANEL
ALT: 29 IU/L (ref 0–32)
AST: 28 IU/L (ref 0–40)
Albumin/Globulin Ratio: 1.6 (ref 1.2–2.2)
Albumin: 4.4 g/dL (ref 3.8–4.8)
Alkaline Phosphatase: 140 IU/L — ABNORMAL HIGH (ref 44–121)
BUN/Creatinine Ratio: 19 (ref 12–28)
BUN: 17 mg/dL (ref 8–27)
Bilirubin Total: 0.4 mg/dL (ref 0.0–1.2)
CO2: 27 mmol/L (ref 20–29)
Calcium: 9.6 mg/dL (ref 8.7–10.3)
Chloride: 103 mmol/L (ref 96–106)
Creatinine, Ser: 0.88 mg/dL (ref 0.57–1.00)
Globulin, Total: 2.7 g/dL (ref 1.5–4.5)
Glucose: 96 mg/dL (ref 70–99)
Potassium: 3.4 mmol/L — ABNORMAL LOW (ref 3.5–5.2)
Sodium: 145 mmol/L — ABNORMAL HIGH (ref 134–144)
Total Protein: 7.1 g/dL (ref 6.0–8.5)
eGFR: 73 mL/min/{1.73_m2} (ref >59)

## 2022-06-01 ENCOUNTER — Other Ambulatory Visit (HOSPITAL_COMMUNITY)
Admission: RE | Admit: 2022-06-01 | Discharge: 2022-06-01 | Disposition: A | Discharge status: Home / Self Care | Payer: 59 | Source: Ambulatory Visit | Attending: Obstetrics and Gynecology | Admitting: Obstetrics and Gynecology

## 2022-06-01 ENCOUNTER — Encounter: Payer: Self-pay | Admitting: Obstetrics and Gynecology

## 2022-06-01 ENCOUNTER — Ambulatory Visit (INDEPENDENT_AMBULATORY_CARE_PROVIDER_SITE_OTHER): Payer: 59 | Admitting: Obstetrics and Gynecology

## 2022-06-01 VITALS — BP 131/70 | HR 74 | Ht 60.0 in | Wt 184.7 lb

## 2022-06-01 DIAGNOSIS — N72 Inflammatory disease of cervix uteri: Secondary | ICD-10-CM | POA: Diagnosis not present

## 2022-06-01 DIAGNOSIS — R87612 Low grade squamous intraepithelial lesion on cytologic smear of cervix (LGSIL): Secondary | ICD-10-CM | POA: Diagnosis not present

## 2022-06-01 DIAGNOSIS — B977 Papillomavirus as the cause of diseases classified elsewhere: Secondary | ICD-10-CM | POA: Diagnosis not present

## 2022-06-01 DIAGNOSIS — R69 Illness, unspecified: Secondary | ICD-10-CM | POA: Diagnosis not present

## 2022-06-01 NOTE — Progress Notes (Signed)
Patient presents today for colposcopy. Her recent pap smear resulted in Lgsil/HPV+. Patient states no questions at this time.

## 2022-06-01 NOTE — Progress Notes (Signed)
HPI:  Chelsea [name] is a 66 y.o.  [id number]  who presents today for evaluation and management of abnormal cervical cytology.    Dysplasia History: Patient has a fairly long history of CIN-1.  She has positive type 16 HPV.  She has had several previous colposcopies.  Her most recent Pap smear reveals LGSIL with positive HPV.  ROS:  Pertinent items noted in HPI and remainder of comprehensive ROS otherwise negative.  OB History  Gravida Para Term Preterm AB Living  5 2 2   3 2   SAB IAB Ectopic Multiple Live Births  3       2    # Outcome Date GA Lbr Len/2nd Weight Sex Delivery Anes PTL Lv  5 Term 1992     CS-Unspec   LIV  4 SAB 1987          3 Term 1985     Vag-Spont   LIV  2 SAB 1984          1 SAB 1982            Past Medical History:  Diagnosis Date   Diverticulosis    Hypertension     Past Surgical History:  Procedure Laterality Date   arm surgery     BREAST BIOPSY Right 2015   papilloma   BREAST EXCISIONAL BIOPSY Right 2015   neg   CESAREAN SECTION     COLONOSCOPY     GANGLION CYST EXCISION Right    KNEE ARTHROSCOPY WITH MEDIAL MENISECTOMY Left 04/22/2016   Procedure: KNEE ARTHROSCOPY WITH MEDIAL MENISECTOMY;  Surgeon: 06/22/2016, MD;  Location: [facility] ORS;  Service: Orthopedics;  Laterality: Left;   TONSILLECTOMY     TRIGGER FINGER RELEASE Right     SOCIAL HISTORY:  Social History   Substance and Sexual Activity  Alcohol Use No    Social History   Substance and Sexual Activity  Drug Use No     Family History  Problem Relation Age of Onset   Heart attack Mother 62   Breast cancer Mother    Kidney disease Father    Diabetes Father     ALLERGIES:  Patient has no known allergies.  She has a current medication list which includes the following prescription(s): amlodipine, cetirizine, vitamin d3, multivitamin, omega-3 fatty acids, triamterene-hydrochlorothiazide, zaleplon, and [DISCONTINUED] hydrochlorothiazide.  Physical Exam: -Vitals:  BP  131/70   Pulse 74   Ht 5' (1.524 m)   Wt 184 lb 11.2 oz (83.8 kg)   BMI 36.07 kg/m   PROCEDURE: Colposcopy performed with 4% acetic acid after verbal consent obtained                           There are no exocervical lesions visible by colposcopy.          * She has very significant cervical stenosis making ECC difficult to impossible*              -ECC indicated and performed: Yes.  With endocervical curette and Cytobrush     -Biopsy sites made hemostatic with pressure and Monsel's solution   -Satisfactory colposcopy: No.    -Evidence of Invasive cervical CA :  NO  ASSESSMENT:  Chelsea [name] is a 66 y.o. 77 here for  1. Low grade squamous intraepithelial lesion on cytologic smear of cervix (LGSIL)   2. High risk human papilloma virus (HPV) infection of cervix   .  PLAN: 1.  I discussed the grading system of pap smears and HPV high risk viral types.  We will discuss management after colpo results return. We will await ECC results.  But as previously discussed if there is an abnormality I would recommend LEEP.  Persistent CIN-1 with inadequate colposcopy.  Likely recommend OR for this procedure.  No orders of the defined types were placed in this encounter.        F/U  Return in about 2 weeks (around 06/15/2022).  Brennan Bailey ,MD 06/01/2022,3:39 PM

## 2022-06-03 LAB — SURGICAL PATHOLOGY

## 2022-06-10 ENCOUNTER — Encounter: Payer: 59 | Admitting: Obstetrics and Gynecology

## 2022-06-10 ENCOUNTER — Ambulatory Visit
Admission: RE | Admit: 2022-06-10 | Discharge: 2022-06-10 | Disposition: A | Discharge status: Home / Self Care | Payer: 59 | Source: Ambulatory Visit | Attending: Obstetrics and Gynecology | Admitting: Obstetrics and Gynecology

## 2022-06-10 DIAGNOSIS — Z1231 Encounter for screening mammogram for malignant neoplasm of breast: Secondary | ICD-10-CM | POA: Insufficient documentation

## 2022-06-10 DIAGNOSIS — Z01419 Encounter for gynecological examination (general) (routine) without abnormal findings: Secondary | ICD-10-CM

## 2022-06-16 ENCOUNTER — Encounter: Payer: Self-pay | Admitting: Obstetrics and Gynecology

## 2022-06-16 ENCOUNTER — Telehealth: Payer: 59 | Admitting: Obstetrics and Gynecology

## 2022-06-16 DIAGNOSIS — B977 Papillomavirus as the cause of diseases classified elsewhere: Secondary | ICD-10-CM | POA: Diagnosis not present

## 2022-06-16 DIAGNOSIS — R69 Illness, unspecified: Secondary | ICD-10-CM | POA: Diagnosis not present

## 2022-06-16 DIAGNOSIS — N72 Inflammatory disease of cervix uteri: Secondary | ICD-10-CM | POA: Diagnosis not present

## 2022-06-16 DIAGNOSIS — R87612 Low grade squamous intraepithelial lesion on cytologic smear of cervix (LGSIL): Secondary | ICD-10-CM | POA: Diagnosis not present

## 2022-06-16 NOTE — Progress Notes (Signed)
Virtual Visit via Video Note  I connected with Chelsea [name] on 06/16/22 at  7:30 AM EDT by video and verified that I was speaking with the correct person using two identifiers.    Chelsea [name] is a 66 y.o. [id number] who LMP was No LMP recorded. Patient is postmenopausal. I discussed the limitations, risks, security and privacy concerns of performing an evaluation and management service by video and the availability of in person appointments. I also discussed with the patient that there may be a patient responsible charge related to this service. The patient expressed understanding and agreed to proceed.  Location of patient:  Home  Patient gave explicit verbal consent for video visit:  YES  Location of provider:  [facility] office  Persons other than physician and patient involved in provider conference:  None   Subjective:   History of Present Illness:    She has a history of persistent CIN-1 over several colposcopies and Pap smears. Her last colposcopy was essentially considered inadequate because there were no external lesions noted. An ECC was performed revealing CIN-1 in the endocervical canal. She presents today to discuss these findings and possible treatment plans.  Hx: The following portions of the patient's history were reviewed and updated as appropriate:             She  has a past medical history of Diverticulosis and Hypertension. She does not have any pertinent problems on file. She  has a past surgical history that includes arm surgery; Cesarean section; Ganglion cyst excision (Right); Trigger finger release (Right); Colonoscopy; Tonsillectomy; Knee arthroscopy with medial menisectomy (Left, 04/22/2016); Breast biopsy (Right, 2015); and Breast excisional biopsy (Right, 2015). Her family history includes Breast cancer in her mother; Diabetes in her father; Heart attack (age of onset: 28) in her mother; Kidney disease in her father. She  reports that she has never smoked.  She has never been exposed to tobacco smoke. She has never used smokeless tobacco. She reports that she does not drink alcohol and does not use drugs. She has a current medication list which includes the following prescription(s): amlodipine, cetirizine, vitamin d3, multivitamin, omega-3 fatty acids, triamterene-hydrochlorothiazide, zaleplon, and [DISCONTINUED] hydrochlorothiazide. She has No Known Allergies.       Review of Systems:  Review of Systems  Constitutional: Denied constitutional symptoms, night sweats, recent illness, fatigue, fever, insomnia and weight loss.  Eyes: Denied eye symptoms, eye pain, photophobia, vision change and visual disturbance.  Ears/Nose/Throat/Neck: Denied ear, nose, throat or neck symptoms, hearing loss, nasal discharge, sinus congestion and sore throat.  Cardiovascular: Denied cardiovascular symptoms, arrhythmia, chest pain/pressure, edema, exercise intolerance, orthopnea and palpitations.  Respiratory: Denied pulmonary symptoms, asthma, pleuritic pain, productive sputum, cough, dyspnea and wheezing.  Gastrointestinal: Denied, gastro-esophageal reflux, melena, nausea and vomiting.  Genitourinary: Denied genitourinary symptoms including symptomatic vaginal discharge, pelvic relaxation issues, and urinary complaints.  Musculoskeletal: Denied musculoskeletal symptoms, stiffness, swelling, muscle weakness and myalgia.  Dermatologic: Denied dermatology symptoms, rash and scar.  Neurologic: Denied neurology symptoms, dizziness, headache, neck pain and syncope.  Psychiatric: Denied psychiatric symptoms, anxiety and depression.  Endocrine: Denied endocrine symptoms including hot flashes and night sweats.   Meds:   Current Outpatient Medications on File Prior to Visit  Medication Sig Dispense Refill   amLODipine (NORVASC) 10 MG tablet Take 1 tablet (10 mg total) by mouth daily. 90 tablet 1   cetirizine (ZYRTEC) 10 MG tablet Take 10 mg by mouth daily.      Cholecalciferol (VITAMIN D3)  5000 units TABS Take 1 tablet by mouth daily.     Multiple Vitamin (MULTIVITAMIN) tablet Take 1 tablet by mouth daily.     Omega-3 Fatty Acids (FISH OIL PO) Take 1 tablet by mouth daily.     triamterene-hydrochlorothiazide (MAXZIDE-25) 37.5-25 MG tablet Take 1 tablet by mouth daily. 90 tablet 1   zaleplon (SONATA) 10 MG capsule Take 1 capsule (10 mg total) by mouth at bedtime as needed for sleep. 30 capsule 2   [DISCONTINUED] hydrochlorothiazide (HYDRODIURIL) 25 MG tablet Take 25 mg by mouth daily.     No current facility-administered medications on file prior to visit.    Assessment:    [id number] Patient Active Problem List   Diagnosis Date Noted   Mild hyperlipidemia 05/27/2022   Other insomnia 05/01/2020   Essential hypertension 12/28/2019   Dysplasia of cervix, low grade (CIN 1) 12/28/2019   Carpal tunnel syndrome 05/30/2014     1. Low grade squamous intraepithelial lesion on cytologic smear of cervix (LGSIL)   2. High risk human papilloma virus (HPV) infection of cervix     Inadequate colposcopy-lesion located in the endocervical canal   Persistent CIN-1  Plan:            1.  We have discussed CIN-1 again in detail.  Because of this long persistence and the high risk nature of type 16 as well as the fact that an inadequate colposcopy was performed because the lesion is located within the endocervical canal, I believe it is in her best interest to go ahead and have this area removed by LEEP.  We have discussed this in some detail.  She is also getting tired of Pap smear and colposcopy follow-ups and would rather have treatment then continue having further tests performed. All questions were answered. She was scheduled for a preop appointment in the next 2 to 4 weeks. Orders No orders of the defined types were placed in this encounter.   No orders of the defined types were placed in this encounter.     F/U  Return in about 2 weeks (around  06/30/2022). I spent 21 minutes involved in the care of this patient preparing to see the patient by obtaining and reviewing her medical history (including labs, imaging tests and prior procedures), documenting clinical information in the electronic health record (EHR), counseling and coordinating care plans, writing and sending prescriptions, ordering tests or procedures and in direct communicating with the patient and medical staff discussing pertinent items from her history and physical exam.  Elonda Husky, M.D. 06/16/2022 7:42 AM

## 2022-07-13 ENCOUNTER — Ambulatory Visit (INDEPENDENT_AMBULATORY_CARE_PROVIDER_SITE_OTHER): Payer: PRIVATE HEALTH INSURANCE | Admitting: Obstetrics and Gynecology

## 2022-07-13 ENCOUNTER — Encounter: Payer: Self-pay | Admitting: Obstetrics and Gynecology

## 2022-07-13 VITALS — BP 148/76 | HR 61 | Ht 60.0 in | Wt 187.5 lb

## 2022-07-13 DIAGNOSIS — N72 Inflammatory disease of cervix uteri: Secondary | ICD-10-CM

## 2022-07-13 DIAGNOSIS — R87612 Low grade squamous intraepithelial lesion on cytologic smear of cervix (LGSIL): Secondary | ICD-10-CM

## 2022-07-13 DIAGNOSIS — B977 Papillomavirus as the cause of diseases classified elsewhere: Secondary | ICD-10-CM

## 2022-07-13 DIAGNOSIS — Z01818 Encounter for other preprocedural examination: Secondary | ICD-10-CM | POA: Diagnosis not present

## 2022-07-13 DIAGNOSIS — N870 Mild cervical dysplasia: Secondary | ICD-10-CM

## 2022-07-13 NOTE — H&P (Signed)
PRE-OPERATIVE HISTORY AND PHYSICAL EXAM  PCP:  Reubin Milan, MD Subjective:   HPI:  Chelsea [name] is a 66 y.o. [phone].  No LMP recorded. Patient is postmenopausal.  She presents today for a pre-op discussion and PE.  She has the following symptoms: Persistent CIN-1.  CIN-1 of the endocervical canal unable to be visualized by colposcopy.  Review of Systems:   Constitutional: Denied constitutional symptoms, night sweats, recent illness, fatigue, fever, insomnia and weight loss.  Eyes: Denied eye symptoms, eye pain, photophobia, vision change and visual disturbance.  Ears/Nose/Throat/Neck: Denied ear, nose, throat or neck symptoms, hearing loss, nasal discharge, sinus congestion and sore throat.  Cardiovascular: Denied cardiovascular symptoms, arrhythmia, chest pain/pressure, edema, exercise intolerance, orthopnea and palpitations.  Respiratory: Denied pulmonary symptoms, asthma, pleuritic pain, productive sputum, cough, dyspnea and wheezing.  Gastrointestinal: Denied, gastro-esophageal reflux, melena, nausea and vomiting.  Genitourinary: Denied genitourinary symptoms including symptomatic vaginal discharge, pelvic relaxation issues, and urinary complaints.  Musculoskeletal: Denied musculoskeletal symptoms, stiffness, swelling, muscle weakness and myalgia.  Dermatologic: Denied dermatology symptoms, rash and scar.  Neurologic: Denied neurology symptoms, dizziness, headache, neck pain and syncope.  Psychiatric: Denied psychiatric symptoms, anxiety and depression.  Endocrine: Denied endocrine symptoms including hot flashes and night sweats.   OB History  Gravida Para Term Preterm AB Living  5 2 2   3 2   SAB IAB Ectopic Multiple Live Births  3       2    # Outcome Date GA Lbr Len/2nd Weight Sex Delivery Anes PTL Lv  5 Term 1992     CS-Unspec   LIV  4 SAB 1987          3 Term 1985     Vag-Spont   LIV  2 SAB 1984          1 SAB 1982            Past Medical History:   Diagnosis Date   Diverticulosis    Hypertension     Past Surgical History:  Procedure Laterality Date   arm surgery     BREAST BIOPSY Right 2015   papilloma   BREAST EXCISIONAL BIOPSY Right 2015   neg   CESAREAN SECTION     COLONOSCOPY     GANGLION CYST EXCISION Right    KNEE ARTHROSCOPY WITH MEDIAL MENISECTOMY Left 04/22/2016   Procedure: KNEE ARTHROSCOPY WITH MEDIAL MENISECTOMY;  Surgeon: 06/22/2016, MD;  Location: [facility];  Service: Orthopedics;  Laterality: Left;   TONSILLECTOMY     TRIGGER FINGER RELEASE Right       SOCIAL HISTORY:  Social History   Tobacco Use  Smoking Status Never   Passive exposure: Never  Smokeless Tobacco Never   Social History   Substance and Sexual Activity  Alcohol Use No    Social History   Substance and Sexual Activity  Drug Use No    Family History  Problem Relation Age of Onset   Heart attack Mother 5   Breast cancer Mother    Kidney disease Father    Diabetes Father     ALLERGIES:  Patient has no known allergies.  MEDS:   Current Outpatient Medications on File Prior to Visit  Medication Sig Dispense Refill   amLODipine (NORVASC) 10 MG tablet Take 1 tablet (10 mg total) by mouth daily. 90 tablet 1   cetirizine (ZYRTEC) 10 MG tablet Take 10 mg by mouth daily.     Cholecalciferol (VITAMIN D3)  5000 units TABS Take 1 tablet by mouth daily.     Multiple Vitamin (MULTIVITAMIN) tablet Take 1 tablet by mouth daily.     Omega-3 Fatty Acids (FISH OIL PO) Take 1 tablet by mouth daily.     triamterene-hydrochlorothiazide (MAXZIDE-25) 37.5-25 MG tablet Take 1 tablet by mouth daily. 90 tablet 1   zaleplon (SONATA) 10 MG capsule Take 1 capsule (10 mg total) by mouth at bedtime as needed for sleep. 30 capsule 2   [DISCONTINUED] hydrochlorothiazide (HYDRODIURIL) 25 MG tablet Take 25 mg by mouth daily.     No current facility-administered medications on file prior to visit.    No orders of the defined types were placed in this  encounter.    Physical examination BP (!) 148/76   Pulse 61   Ht 5' (1.524 m)   Wt 187 lb 8 oz (85 kg)   BMI 36.62 kg/m   General NAD, Conversant  HEENT Atraumatic; Op clear with mmm.  Normo-cephalic. Pupils reactive. Anicteric sclerae  Thyroid/Neck Smooth without nodularity or enlargement. Normal ROM.  Neck Supple.  Skin No rashes, lesions or ulceration. Normal palpated skin turgor. No nodularity.  Breasts: No masses or discharge.  Symmetric.  No axillary adenopathy.  Lungs: Clear to auscultation.No rales or wheezes. Normal Respiratory effort, no retractions.  Heart: NSR.  No murmurs or rubs appreciated. No periferal edema  Abdomen: Soft.  Non-tender.  No masses.  No HSM. No hernia  Extremities: Moves all appropriately.  Normal ROM for age. No lymphadenopathy.  Neuro: Oriented to PPT.  Normal mood. Normal affect.     Pelvic: Deferred to OR -multiple previous colposcopies   Assessment:   [id number] Patient Active Problem List   Diagnosis Date Noted   Mild hyperlipidemia 05/27/2022   Other insomnia 05/01/2020   Essential hypertension 12/28/2019   Dysplasia of cervix, low grade (CIN 1) 12/28/2019   Carpal tunnel syndrome 05/30/2014    1. Pre-op exam   2. Low grade squamous intraepithelial lesion on cytologic smear of cervix (LGSIL)   3. High risk human papilloma virus (HPV) infection of cervix   4. CIN I (cervical intraepithelial neoplasia I)   5.  Inadequate colposcopy -CIN located in the endocervical canal.   Plan:   Orders: No orders of the defined types were placed in this encounter.    1.  LEEP  Pre-op discussions regarding Risks and Benefits of her scheduled surgery.  Necessity of LEEP discussed.  Inadequate colposcopy was reviewed.  Persistence of CIN-1 discussed.  Natural course and history of HPV and its relationship to cervical cancer discussed.  Low incidence of failure rate after LEEP discussed.  Follow-up Pap smear discussed. Risks of bleeding infection  and anesthesia discussed.

## 2022-07-13 NOTE — Progress Notes (Signed)
PRE-OPERATIVE HISTORY AND PHYSICAL EXAM  PCP:  Reubin Milan, MD Subjective:   HPI:  Chelsea [name] is a 66 y.o. [phone].  No LMP recorded. Patient is postmenopausal.  She presents today for a pre-op discussion and PE.  She has the following symptoms: Persistent CIN-1.  CIN-1 of the endocervical canal unable to be visualized by colposcopy.  Review of Systems:   Constitutional: Denied constitutional symptoms, night sweats, recent illness, fatigue, fever, insomnia and weight loss.  Eyes: Denied eye symptoms, eye pain, photophobia, vision change and visual disturbance.  Ears/Nose/Throat/Neck: Denied ear, nose, throat or neck symptoms, hearing loss, nasal discharge, sinus congestion and sore throat.  Cardiovascular: Denied cardiovascular symptoms, arrhythmia, chest pain/pressure, edema, exercise intolerance, orthopnea and palpitations.  Respiratory: Denied pulmonary symptoms, asthma, pleuritic pain, productive sputum, cough, dyspnea and wheezing.  Gastrointestinal: Denied, gastro-esophageal reflux, melena, nausea and vomiting.  Genitourinary: Denied genitourinary symptoms including symptomatic vaginal discharge, pelvic relaxation issues, and urinary complaints.  Musculoskeletal: Denied musculoskeletal symptoms, stiffness, swelling, muscle weakness and myalgia.  Dermatologic: Denied dermatology symptoms, rash and scar.  Neurologic: Denied neurology symptoms, dizziness, headache, neck pain and syncope.  Psychiatric: Denied psychiatric symptoms, anxiety and depression.  Endocrine: Denied endocrine symptoms including hot flashes and night sweats.   OB History  Gravida Para Term Preterm AB Living  5 2 2   3 2   SAB IAB Ectopic Multiple Live Births  3       2    # Outcome Date GA Lbr Len/2nd Weight Sex Delivery Anes PTL Lv  5 Term 1992     CS-Unspec   LIV  4 SAB 1987          3 Term 1985     Vag-Spont   LIV  2 SAB 1984          1 SAB 1982            Past Medical History:   Diagnosis Date   Diverticulosis    Hypertension     Past Surgical History:  Procedure Laterality Date   arm surgery     BREAST BIOPSY Right 2015   papilloma   BREAST EXCISIONAL BIOPSY Right 2015   neg   CESAREAN SECTION     COLONOSCOPY     GANGLION CYST EXCISION Right    KNEE ARTHROSCOPY WITH MEDIAL MENISECTOMY Left 04/22/2016   Procedure: KNEE ARTHROSCOPY WITH MEDIAL MENISECTOMY;  Surgeon: 06/22/2016, MD;  Location: [facility];  Service: Orthopedics;  Laterality: Left;   TONSILLECTOMY     TRIGGER FINGER RELEASE Right       SOCIAL HISTORY:  Social History   Tobacco Use  Smoking Status Never   Passive exposure: Never  Smokeless Tobacco Never   Social History   Substance and Sexual Activity  Alcohol Use No    Social History   Substance and Sexual Activity  Drug Use No    Family History  Problem Relation Age of Onset   Heart attack Mother 5   Breast cancer Mother    Kidney disease Father    Diabetes Father     ALLERGIES:  Patient has no known allergies.  MEDS:   Current Outpatient Medications on File Prior to Visit  Medication Sig Dispense Refill   amLODipine (NORVASC) 10 MG tablet Take 1 tablet (10 mg total) by mouth daily. 90 tablet 1   cetirizine (ZYRTEC) 10 MG tablet Take 10 mg by mouth daily.     Cholecalciferol (VITAMIN D3)  5000 units TABS Take 1 tablet by mouth daily.     Multiple Vitamin (MULTIVITAMIN) tablet Take 1 tablet by mouth daily.     Omega-3 Fatty Acids (FISH OIL PO) Take 1 tablet by mouth daily.     triamterene-hydrochlorothiazide (MAXZIDE-25) 37.5-25 MG tablet Take 1 tablet by mouth daily. 90 tablet 1   zaleplon (SONATA) 10 MG capsule Take 1 capsule (10 mg total) by mouth at bedtime as needed for sleep. 30 capsule 2   [DISCONTINUED] hydrochlorothiazide (HYDRODIURIL) 25 MG tablet Take 25 mg by mouth daily.     No current facility-administered medications on file prior to visit.    No orders of the defined types were placed in this  encounter.    Physical examination BP (!) 148/76   Pulse 61   Ht 5' (1.524 m)   Wt 187 lb 8 oz (85 kg)   BMI 36.62 kg/m   General NAD, Conversant  HEENT Atraumatic; Op clear with mmm.  Normo-cephalic. Pupils reactive. Anicteric sclerae  Thyroid/Neck Smooth without nodularity or enlargement. Normal ROM.  Neck Supple.  Skin No rashes, lesions or ulceration. Normal palpated skin turgor. No nodularity.  Breasts: No masses or discharge.  Symmetric.  No axillary adenopathy.  Lungs: Clear to auscultation.No rales or wheezes. Normal Respiratory effort, no retractions.  Heart: NSR.  No murmurs or rubs appreciated. No periferal edema  Abdomen: Soft.  Non-tender.  No masses.  No HSM. No hernia  Extremities: Moves all appropriately.  Normal ROM for age. No lymphadenopathy.  Neuro: Oriented to PPT.  Normal mood. Normal affect.     Pelvic: Deferred to OR -multiple previous colposcopies   Assessment:   [id number] Patient Active Problem List   Diagnosis Date Noted   Mild hyperlipidemia 05/27/2022   Other insomnia 05/01/2020   Essential hypertension 12/28/2019   Dysplasia of cervix, low grade (CIN 1) 12/28/2019   Carpal tunnel syndrome 05/30/2014    1. Pre-op exam   2. Low grade squamous intraepithelial lesion on cytologic smear of cervix (LGSIL)   3. High risk human papilloma virus (HPV) infection of cervix   4. CIN I (cervical intraepithelial neoplasia I)   5.  Inadequate colposcopy -CIN located in the endocervical canal.   Plan:   Orders: No orders of the defined types were placed in this encounter.    1.  LEEP  Pre-op discussions regarding Risks and Benefits of her scheduled surgery.  Necessity of LEEP discussed.  Inadequate colposcopy was reviewed.  Persistence of CIN-1 discussed.  Natural course and history of HPV and its relationship to cervical cancer discussed.  Low incidence of failure rate after LEEP discussed.  Follow-up Pap smear discussed. Risks of bleeding infection  and anesthesia discussed.  I spent 29 minutes involved in the care of this patient preparing to see the patient by obtaining and reviewing her medical history (including labs, imaging tests and prior procedures), documenting clinical information in the electronic health record (EHR), counseling and coordinating care plans, writing and sending prescriptions, ordering tests or procedures and in direct communicating with the patient and medical staff discussing pertinent items from her history and physical exam.   Elonda Husky, M.D. 07/13/2022 10:27 AM

## 2022-07-13 NOTE — Progress Notes (Signed)
Patient presents for a pre-op visit today for a LEEP. She states no concerns at this time just curious about the procedure.

## 2022-07-13 NOTE — H&P (View-Only) (Signed)
PRE-OPERATIVE HISTORY AND PHYSICAL EXAM  PCP:  Reubin Milan, MD Subjective:   HPI:  Chelsea [name] is a 66 y.o. [phone].  No LMP recorded. Patient is postmenopausal.  She presents today for a pre-op discussion and PE.  She has the following symptoms: Persistent CIN-1.  CIN-1 of the endocervical canal unable to be visualized by colposcopy.  Review of Systems:   Constitutional: Denied constitutional symptoms, night sweats, recent illness, fatigue, fever, insomnia and weight loss.  Eyes: Denied eye symptoms, eye pain, photophobia, vision change and visual disturbance.  Ears/Nose/Throat/Neck: Denied ear, nose, throat or neck symptoms, hearing loss, nasal discharge, sinus congestion and sore throat.  Cardiovascular: Denied cardiovascular symptoms, arrhythmia, chest pain/pressure, edema, exercise intolerance, orthopnea and palpitations.  Respiratory: Denied pulmonary symptoms, asthma, pleuritic pain, productive sputum, cough, dyspnea and wheezing.  Gastrointestinal: Denied, gastro-esophageal reflux, melena, nausea and vomiting.  Genitourinary: Denied genitourinary symptoms including symptomatic vaginal discharge, pelvic relaxation issues, and urinary complaints.  Musculoskeletal: Denied musculoskeletal symptoms, stiffness, swelling, muscle weakness and myalgia.  Dermatologic: Denied dermatology symptoms, rash and scar.  Neurologic: Denied neurology symptoms, dizziness, headache, neck pain and syncope.  Psychiatric: Denied psychiatric symptoms, anxiety and depression.  Endocrine: Denied endocrine symptoms including hot flashes and night sweats.   OB History  Gravida Para Term Preterm AB Living  5 2 2   3 2   SAB IAB Ectopic Multiple Live Births  3       2    # Outcome Date GA Lbr Len/2nd Weight Sex Delivery Anes PTL Lv  5 Term 1992     CS-Unspec   LIV  4 SAB 1987          3 Term 1985     Vag-Spont   LIV  2 SAB 1984          1 SAB 1982            Past Medical History:   Diagnosis Date   Diverticulosis    Hypertension     Past Surgical History:  Procedure Laterality Date   arm surgery     BREAST BIOPSY Right 2015   papilloma   BREAST EXCISIONAL BIOPSY Right 2015   neg   CESAREAN SECTION     COLONOSCOPY     GANGLION CYST EXCISION Right    KNEE ARTHROSCOPY WITH MEDIAL MENISECTOMY Left 04/22/2016   Procedure: KNEE ARTHROSCOPY WITH MEDIAL MENISECTOMY;  Surgeon: 06/22/2016, MD;  Location: [facility];  Service: Orthopedics;  Laterality: Left;   TONSILLECTOMY     TRIGGER FINGER RELEASE Right       SOCIAL HISTORY:  Social History   Tobacco Use  Smoking Status Never   Passive exposure: Never  Smokeless Tobacco Never   Social History   Substance and Sexual Activity  Alcohol Use No    Social History   Substance and Sexual Activity  Drug Use No    Family History  Problem Relation Age of Onset   Heart attack Mother 5   Breast cancer Mother    Kidney disease Father    Diabetes Father     ALLERGIES:  Patient has no known allergies.  MEDS:   Current Outpatient Medications on File Prior to Visit  Medication Sig Dispense Refill   amLODipine (NORVASC) 10 MG tablet Take 1 tablet (10 mg total) by mouth daily. 90 tablet 1   cetirizine (ZYRTEC) 10 MG tablet Take 10 mg by mouth daily.     Cholecalciferol (VITAMIN D3)  5000 units TABS Take 1 tablet by mouth daily.     Multiple Vitamin (MULTIVITAMIN) tablet Take 1 tablet by mouth daily.     Omega-3 Fatty Acids (FISH OIL PO) Take 1 tablet by mouth daily.     triamterene-hydrochlorothiazide (MAXZIDE-25) 37.5-25 MG tablet Take 1 tablet by mouth daily. 90 tablet 1   zaleplon (SONATA) 10 MG capsule Take 1 capsule (10 mg total) by mouth at bedtime as needed for sleep. 30 capsule 2   [DISCONTINUED] hydrochlorothiazide (HYDRODIURIL) 25 MG tablet Take 25 mg by mouth daily.     No current facility-administered medications on file prior to visit.    No orders of the defined types were placed in this  encounter.    Physical examination BP (!) 148/76   Pulse 61   Ht 5' (1.524 m)   Wt 187 lb 8 oz (85 kg)   BMI 36.62 kg/m   General NAD, Conversant  HEENT Atraumatic; Op clear with mmm.  Normo-cephalic. Pupils reactive. Anicteric sclerae  Thyroid/Neck Smooth without nodularity or enlargement. Normal ROM.  Neck Supple.  Skin No rashes, lesions or ulceration. Normal palpated skin turgor. No nodularity.  Breasts: No masses or discharge.  Symmetric.  No axillary adenopathy.  Lungs: Clear to auscultation.No rales or wheezes. Normal Respiratory effort, no retractions.  Heart: NSR.  No murmurs or rubs appreciated. No periferal edema  Abdomen: Soft.  Non-tender.  No masses.  No HSM. No hernia  Extremities: Moves all appropriately.  Normal ROM for age. No lymphadenopathy.  Neuro: Oriented to PPT.  Normal mood. Normal affect.     Pelvic: Deferred to OR -multiple previous colposcopies   Assessment:   [id number] Patient Active Problem List   Diagnosis Date Noted   Mild hyperlipidemia 05/27/2022   Other insomnia 05/01/2020   Essential hypertension 12/28/2019   Dysplasia of cervix, low grade (CIN 1) 12/28/2019   Carpal tunnel syndrome 05/30/2014    1. Pre-op exam   2. Low grade squamous intraepithelial lesion on cytologic smear of cervix (LGSIL)   3. High risk human papilloma virus (HPV) infection of cervix   4. CIN I (cervical intraepithelial neoplasia I)   5.  Inadequate colposcopy -CIN located in the endocervical canal.   Plan:   Orders: No orders of the defined types were placed in this encounter.    1.  LEEP  Pre-op discussions regarding Risks and Benefits of her scheduled surgery.  Necessity of LEEP discussed.  Inadequate colposcopy was reviewed.  Persistence of CIN-1 discussed.  Natural course and history of HPV and its relationship to cervical cancer discussed.  Low incidence of failure rate after LEEP discussed.  Follow-up Pap smear discussed. Risks of bleeding infection  and anesthesia discussed. 

## 2022-07-16 ENCOUNTER — Encounter
Admission: RE | Admit: 2022-07-16 | Discharge: 2022-07-16 | Disposition: A | Discharge status: Home / Self Care | Payer: PPO | Source: Ambulatory Visit | Attending: Obstetrics and Gynecology | Admitting: Obstetrics and Gynecology

## 2022-07-16 ENCOUNTER — Other Ambulatory Visit: Payer: Self-pay

## 2022-07-16 ENCOUNTER — Encounter: Payer: Self-pay | Admitting: Urgent Care

## 2022-07-16 DIAGNOSIS — Z01812 Encounter for preprocedural laboratory examination: Secondary | ICD-10-CM

## 2022-07-16 DIAGNOSIS — Z01818 Encounter for other preprocedural examination: Secondary | ICD-10-CM | POA: Diagnosis not present

## 2022-07-16 DIAGNOSIS — N870 Mild cervical dysplasia: Secondary | ICD-10-CM | POA: Insufficient documentation

## 2022-07-16 DIAGNOSIS — I10 Essential (primary) hypertension: Secondary | ICD-10-CM | POA: Insufficient documentation

## 2022-07-16 HISTORY — DX: Anemia, unspecified: D64.9

## 2022-07-16 LAB — CBC
HCT: 36.5 % (ref 36.0–46.0)
Hemoglobin: 11.2 g/dL — ABNORMAL LOW (ref 12.0–15.0)
MCH: 25.1 pg — ABNORMAL LOW (ref 26.0–34.0)
MCHC: 30.7 g/dL (ref 30.0–36.0)
MCV: 81.8 fL (ref 80.0–100.0)
Platelets: 307 10*3/uL (ref 150–400)
RBC: 4.46 MIL/uL (ref 3.87–5.11)
RDW: 13.9 % (ref 11.5–15.5)
WBC: 6.7 10*3/uL (ref 4.0–10.5)
nRBC: 0.3 % — ABNORMAL HIGH (ref 0.0–0.2)

## 2022-07-16 LAB — TYPE AND SCREEN
ABO/RH(D): O POS
Antibody Screen: NEGATIVE

## 2022-07-16 NOTE — Patient Instructions (Addendum)
Your procedure is scheduled on: 07/19/22 - Monday Report to the Registration Desk on the 1st floor of the [facility]. To find out your arrival time, please call [phone] between 1PM - 3PM on: 07/16/22 - Friday If your arrival time is 6:00 am, do not arrive prior to that time as the [facility] entrance doors do not open until 6:00 am.  REMEMBER: Instructions that are not followed completely may result in serious medical risk, up to and including death; or upon the discretion of your surgeon and anesthesiologist your surgery may need to be rescheduled.  Do not eat food or drink any fluids after midnight the night before surgery.  No gum chewing, lozengers or hard candies.   TAKE THESE MEDICATIONS THE MORNING OF SURGERY WITH A SIP OF WATER: - amLODipine (NORVASC) 10   One week prior to surgery: Stop Anti-inflammatories (NSAIDS) such as Advil, Aleve, Ibuprofen, Motrin, Naproxen, Naprosyn and Aspirin based products such as Excedrin, Goodys Powder, BC Powder.  Stop ANY OVER THE COUNTER supplements until after surgery.Multiple Vitamin , Cholecalciferol (VITAMIN D3) , Omega-3 Fatty Acids , resume taking the day after your surgery.  You may take Tylenol if needed for pain up until the day of surgery.  No Alcohol for 24 hours before or after surgery.  No Smoking including e-cigarettes for 24 hours prior to surgery.  No chewable tobacco products for at least 6 hours prior to surgery.  No nicotine patches on the day of surgery.  Do not use any "recreational" drugs for at least a week prior to your surgery.  Please be advised that the combination of cocaine and anesthesia may have negative outcomes, up to and including death. If you test positive for cocaine, your surgery will be cancelled.  On the morning of surgery brush your teeth with toothpaste and water, you may rinse your mouth with mouthwash if you wish. Do not swallow any toothpaste or mouthwash.  Do not wear jewelry,  make-up, hairpins, clips or nail polish.  Do not wear lotions, powders, or perfumes.   Do not shave body from the neck down 48 hours prior to surgery just in case you cut yourself which could leave a site for infection.  Also, freshly shaved skin may become irritated if using the CHG soap.  Contact lenses, hearing aids and dentures may not be worn into surgery.  Do not bring valuables to the hospital. [facility] is not responsible for any missing/lost belongings or valuables.   Notify your doctor if there is any change in your medical condition (cold, fever, infection).  Wear comfortable clothing (specific to your surgery type) to the hospital.  After surgery, you can help prevent lung complications by doing breathing exercises.  Take deep breaths and cough every 1-2 hours. Your doctor may order a device called an Incentive Spirometer to help you take deep breaths. When coughing or sneezing, hold a pillow firmly against your incision with both hands. This is called "splinting." Doing this helps protect your incision. It also decreases belly discomfort.  If you are being admitted to the hospital overnight, leave your suitcase in the car. After surgery it may be brought to your room.  If you are being discharged the day of surgery, you will not be allowed to drive home. You will need a responsible adult (18 years or older) to drive you home and stay with you that night.   If you are taking public transportation, you will need to have a responsible adult (  18 years or older) with you. Please confirm with your physician that it is acceptable to use public transportation.   Please call the Pre-admissions Testing Dept. at [phone] if you have any questions about these instructions.  Surgery Visitation Policy:  Patients undergoing a surgery or procedure may have two family members or support persons with them as long as the person is not COVID-19 positive or experiencing its symptoms.    Inpatient Visitation:    Visiting hours are 7 a.m. to 8 p.m. Up to four visitors are allowed at one time in a patient room, including children. The visitors may rotate out with other people during the day. One designated support person (adult) may remain overnight.

## 2022-07-18 MED ORDER — FAMOTIDINE 20 MG PO TABS
20.0000 mg | ORAL_TABLET | Freq: Once | ORAL | Status: AC
  Administered 2022-07-19: 20 mg via ORAL

## 2022-07-18 MED ORDER — CHLORHEXIDINE GLUCONATE 0.12 % MT SOLN
15.0000 mL | Freq: Once | OROMUCOSAL | Status: AC
  Administered 2022-07-19: 15 mL via OROMUCOSAL

## 2022-07-18 MED ORDER — ORAL CARE MOUTH RINSE: 15.0000 mL | Freq: Once | OROMUCOSAL | Status: AC

## 2022-07-18 MED ORDER — LACTATED RINGERS IV SOLN: INTRAVENOUS | Status: DC

## 2022-07-18 MED ORDER — POVIDONE-IODINE 10 % EX SWAB: 2.0000 | Freq: Once | CUTANEOUS | Status: DC

## 2022-07-19 ENCOUNTER — Encounter
Admission: RE | Disposition: A | Discharge status: Home / Self Care | Payer: Self-pay | Source: Home / Self Care | Attending: Obstetrics and Gynecology

## 2022-07-19 ENCOUNTER — Encounter: Payer: Self-pay | Admitting: Obstetrics and Gynecology

## 2022-07-19 ENCOUNTER — Ambulatory Visit: Payer: PPO

## 2022-07-19 ENCOUNTER — Ambulatory Visit: Payer: PPO | Admitting: Certified Registered Nurse Anesthetist

## 2022-07-19 ENCOUNTER — Other Ambulatory Visit: Payer: Self-pay

## 2022-07-19 ENCOUNTER — Ambulatory Visit
Admission: RE | Admit: 2022-07-19 | Discharge: 2022-07-19 | Disposition: A | Discharge status: Home / Self Care | Payer: PPO | Attending: Obstetrics and Gynecology | Admitting: Obstetrics and Gynecology

## 2022-07-19 DIAGNOSIS — N870 Mild cervical dysplasia: Secondary | ICD-10-CM | POA: Insufficient documentation

## 2022-07-19 DIAGNOSIS — R87810 Cervical high risk human papillomavirus (HPV) DNA test positive: Secondary | ICD-10-CM | POA: Diagnosis not present

## 2022-07-19 DIAGNOSIS — I10 Essential (primary) hypertension: Secondary | ICD-10-CM | POA: Insufficient documentation

## 2022-07-19 DIAGNOSIS — Z79899 Other long term (current) drug therapy: Secondary | ICD-10-CM | POA: Insufficient documentation

## 2022-07-19 DIAGNOSIS — R87612 Low grade squamous intraepithelial lesion on cytologic smear of cervix (LGSIL): Secondary | ICD-10-CM | POA: Diagnosis not present

## 2022-07-19 HISTORY — PX: LEEP: SHX91

## 2022-07-19 SURGERY — LEEP (LOOP ELECTROSURGICAL EXCISION PROCEDURE)
Anesthesia: General

## 2022-07-19 MED ORDER — FERRIC SUBSULFATE 259 MG/GM EX SOLN
CUTANEOUS | Status: AC
Start: 2022-07-19 — End: ?
  Filled 2022-07-19: qty 8

## 2022-07-19 MED ORDER — PROPOFOL 10 MG/ML IV BOLUS
INTRAVENOUS | Status: AC
Start: 2022-07-19 — End: ?
  Filled 2022-07-19: qty 20

## 2022-07-19 MED ORDER — FENTANYL CITRATE (PF) 100 MCG/2ML IJ SOLN: 25.0000 ug | INTRAMUSCULAR | Status: DC | PRN

## 2022-07-19 MED ORDER — FENTANYL CITRATE (PF) 100 MCG/2ML IJ SOLN
INTRAMUSCULAR | Status: AC
  Filled 2022-07-19: qty 2

## 2022-07-19 MED ORDER — PROPOFOL 10 MG/ML IV BOLUS
INTRAVENOUS | Status: AC
  Filled 2022-07-19: qty 20

## 2022-07-19 MED ORDER — CHLORHEXIDINE GLUCONATE 0.12 % MT SOLN
OROMUCOSAL | Status: AC
  Filled 2022-07-19: qty 15

## 2022-07-19 MED ORDER — ONDANSETRON HCL 4 MG/2ML IJ SOLN
INTRAMUSCULAR | Status: DC | PRN
  Administered 2022-07-19: 4 mg via INTRAVENOUS

## 2022-07-19 MED ORDER — ACETAMINOPHEN 10 MG/ML IV SOLN: 1000.0000 mg | Freq: Once | INTRAVENOUS | Status: DC | PRN

## 2022-07-19 MED ORDER — LIDOCAINE HCL (CARDIAC) PF 100 MG/5ML IV SOSY
PREFILLED_SYRINGE | INTRAVENOUS | Status: DC | PRN
  Administered 2022-07-19: 100 mg via INTRAVENOUS

## 2022-07-19 MED ORDER — MIDAZOLAM HCL 2 MG/2ML IJ SOLN
INTRAMUSCULAR | Status: DC | PRN
  Administered 2022-07-19: 2 mg via INTRAVENOUS

## 2022-07-19 MED ORDER — PROPOFOL 10 MG/ML IV BOLUS
INTRAVENOUS | Status: DC | PRN
  Administered 2022-07-19: 120 mg via INTRAVENOUS

## 2022-07-19 MED ORDER — EPHEDRINE SULFATE (PRESSORS) 50 MG/ML IJ SOLN
INTRAMUSCULAR | Status: DC | PRN
  Administered 2022-07-19: 5 mg via INTRAVENOUS
  Administered 2022-07-19 (×2): 2.5 mg via INTRAVENOUS

## 2022-07-19 MED ORDER — OXYCODONE HCL 5 MG/5ML PO SOLN: 5.0000 mg | Freq: Once | ORAL | Status: DC | PRN

## 2022-07-19 MED ORDER — MIDAZOLAM HCL 2 MG/2ML IJ SOLN
INTRAMUSCULAR | Status: AC
  Filled 2022-07-19: qty 2

## 2022-07-19 MED ORDER — PHENYLEPHRINE 80 MCG/ML (10ML) SYRINGE FOR IV PUSH (FOR BLOOD PRESSURE SUPPORT)
PREFILLED_SYRINGE | INTRAVENOUS | Status: DC | PRN
  Administered 2022-07-19 (×4): 80 ug via INTRAVENOUS

## 2022-07-19 MED ORDER — OXYCODONE HCL 5 MG PO TABS: 5.0000 mg | ORAL_TABLET | Freq: Once | ORAL | Status: DC | PRN

## 2022-07-19 MED ORDER — IODINE STRONG (LUGOLS) 5 % PO SOLN
ORAL | Status: AC
Start: 2022-07-19 — End: ?
  Filled 2022-07-19: qty 1

## 2022-07-19 MED ORDER — FAMOTIDINE 20 MG PO TABS
ORAL_TABLET | ORAL | Status: AC
  Filled 2022-07-19: qty 1

## 2022-07-19 MED ORDER — FENTANYL CITRATE (PF) 100 MCG/2ML IJ SOLN
INTRAMUSCULAR | Status: DC | PRN
  Administered 2022-07-19 (×2): 25 ug via INTRAVENOUS

## 2022-07-19 MED ORDER — SODIUM CHLORIDE (PF) 0.9 % IJ SOLN
INTRAMUSCULAR | Status: AC
Start: 2022-07-19 — End: ?
  Filled 2022-07-19: qty 50

## 2022-07-19 MED ORDER — DEXAMETHASONE SODIUM PHOSPHATE 10 MG/ML IJ SOLN
INTRAMUSCULAR | Status: DC | PRN
  Administered 2022-07-19: 10 mg via INTRAVENOUS

## 2022-07-19 MED ORDER — ONDANSETRON HCL 4 MG/2ML IJ SOLN: 4.0000 mg | Freq: Once | INTRAMUSCULAR | Status: DC | PRN

## 2022-07-19 MED ORDER — FERRIC SUBSULFATE 259 MG/GM EX SOLN
CUTANEOUS | Status: DC | PRN
  Administered 2022-07-19: 1 via TOPICAL

## 2022-07-19 MED ORDER — VASOPRESSIN 20 UNIT/ML IV SOLN
INTRAVENOUS | Status: AC
  Filled 2022-07-19: qty 1

## 2022-07-19 MED ORDER — LIDOCAINE-EPINEPHRINE 1 %-1:100000 IJ SOLN
INTRAMUSCULAR | Status: AC
Start: 2022-07-19 — End: ?
  Filled 2022-07-19: qty 1

## 2022-07-19 SURGICAL SUPPLY — 39 items
APL SWBSTK 6 STRL LF DISP (MISCELLANEOUS) ×1
APPLICATOR COTTON TIP 6 STRL (MISCELLANEOUS) ×1 IMPLANT
APPLICATOR COTTON TIP 6IN STRL (MISCELLANEOUS) ×2
APPLICATOR SWAB PROCTO LG 16IN (MISCELLANEOUS) ×2 IMPLANT
DRAPE UNDER BUTTOCK W/FLU (DRAPES) ×2 IMPLANT
DRSG TELFA 3X8 NADH (GAUZE/BANDAGES/DRESSINGS) ×2 IMPLANT
ELECT LEEP BALL 5MM 12CM (MISCELLANEOUS) ×2
ELECT LEEP LOOP 1.0CM .7CM (MISCELLANEOUS) ×2
ELECT LEEP LOOP 20X10 R2010 (MISCELLANEOUS) ×2
ELECT LOOP 1.0X1.0CM R1010 (MISCELLANEOUS) ×2
ELECT REM PT RETURN 9FT ADLT (ELECTROSURGICAL) ×2
ELECTRODE LEEP BALL 5MM 12CM (MISCELLANEOUS) ×1 IMPLANT
ELECTRODE LEEP LOOP 1.0CM .7CM (MISCELLANEOUS) ×1 IMPLANT
ELECTRODE LEP LOOP 20X10 R2010 (MISCELLANEOUS) ×1 IMPLANT
ELECTRODE LOOP 1.0X1.0CM R1010 (MISCELLANEOUS) ×1 IMPLANT
ELECTRODE REM PT RTRN 9FT ADLT (ELECTROSURGICAL) ×1 IMPLANT
GLOVE BIOGEL PI ORTHO PRO 7.5 (GLOVE) ×1
GLOVE PI ORTHO PRO STRL 7.5 (GLOVE) ×1 IMPLANT
GOWN STRL REUS W/ TWL LRG LVL3 (GOWN DISPOSABLE) ×2 IMPLANT
GOWN STRL REUS W/TWL LRG LVL3 (GOWN DISPOSABLE) ×4
HANDLE YANKAUER SUCT BULB TIP (MISCELLANEOUS) ×2 IMPLANT
KIT TURNOVER CYSTO (KITS) ×2 IMPLANT
LABEL OR SOLS (LABEL) ×2 IMPLANT
MANIFOLD NEPTUNE II (INSTRUMENTS) ×2 IMPLANT
NDL SPNL 22GX3.5 QUINCKE BK (NEEDLE) ×1 IMPLANT
NEEDLE SPNL 22GX3.5 QUINCKE BK (NEEDLE) ×2 IMPLANT
PACK DNC HYST (MISCELLANEOUS) ×2 IMPLANT
PAD DRESSING TELFA 3X8 NADH (GAUZE/BANDAGES/DRESSINGS) ×1 IMPLANT
PAD PREP 24X41 OB/GYN DISP (PERSONAL CARE ITEMS) ×2 IMPLANT
PENCIL SMOKE EVACUATOR (MISCELLANEOUS) ×1 IMPLANT
SCRUB CHG 4% DYNA-HEX 4OZ (MISCELLANEOUS) ×2 IMPLANT
SET CYSTO W/LG BORE CLAMP LF (SET/KITS/TRAYS/PACK) IMPLANT
SOL PREP PVP 2OZ (MISCELLANEOUS) ×2
SOLUTION PREP PVP 2OZ (MISCELLANEOUS) ×1 IMPLANT
STRAW SMOKE EVAC LEEP 6150 NON (MISCELLANEOUS) ×2 IMPLANT
SYR 10ML LL (SYRINGE) ×2 IMPLANT
SYR CONTROL 10ML LL (SYRINGE) ×1 IMPLANT
TUBING CONNECTING 10 (TUBING) ×2 IMPLANT
WATER STERILE IRR 500ML POUR (IV SOLUTION) ×2 IMPLANT

## 2022-07-19 NOTE — Anesthesia Procedure Notes (Signed)
Procedure Name: LMA Insertion Date/Time: 07/19/2022 10:40 AM  Performed by: Joanette Gula, Balin Vandegrift, CRNAPre-anesthesia Checklist: Patient identified, Emergency Drugs available, Suction available and Patient being monitored Patient Re-evaluated:Patient Re-evaluated prior to induction Oxygen Delivery Method: Circle system utilized Preoxygenation: Pre-oxygenation with 100% oxygen Induction Type: IV induction Ventilation: Mask ventilation without difficulty LMA: LMA inserted LMA Size: 4.0 Number of attempts: 1 Placement Confirmation: positive ETCO2 and breath sounds checked- equal and bilateral Tube secured with: Tape Dental Injury: Teeth and Oropharynx as per pre-operative assessment

## 2022-07-19 NOTE — Op Note (Signed)
    OPERATIVE NOTE 07/19/2022 11:16 AM  PRE-OPERATIVE DIAGNOSIS:  1) LGSIL 2) HPV 3) CIN I  POST-OPERATIVE DIAGNOSIS:  1) Same  OPERATION:  LEEP  SURGEON(S): Surgeon(s) and Role:    Linzie Collin, MD - Primary   ANESTHESIA: General  ESTIMATED BLOOD LOSS: 5 mL  SPECIMEN:  ID Type Source Tests Collected by Time Destination  1 : superficial endocervical canal Tissue PATH Gyn biopsy SURGICAL PATHOLOGY Linzie Collin, MD 07/19/2022 1106   2 : deep endocervical canal Tissue PATH Gyn biopsy SURGICAL PATHOLOGY Linzie Collin, MD 07/19/2022 1107   3 : deeper endocervical canal Tissue PATH Gyn biopsy SURGICAL PATHOLOGY Linzie Collin, MD 07/19/2022 1108     COMPLICATIONS: None   DISPOSITION: Stable to recovery room  DESCRIPTION OF PROCEDURE:      Pt was prepped and draped for procedure.  Lugol's solution was used on the cervix.  The cervix was injected in a circumferential manner with Lidocaine with epinephrine.  Using an narrow wire loop I removed a three-layer specimen of the endocervical canal. The ball electrode was used to provide hemostasis.  Bleeding was minimal to none.  Monsel's solution was applied.   She went to RR in stable condition.   Elonda Husky, M.D. 07/19/2022 11:16 AM

## 2022-07-19 NOTE — Interval H&P Note (Signed)
History and Physical Interval Note:  07/19/2022 10:32 AM  Chelsea [name]  has presented today for surgery, with the diagnosis of LGSIL HPV CIN I.  The various methods of treatment have been discussed with the patient and family. After consideration of risks, benefits and other options for treatment, the patient has consented to  Procedure(s): LOOP ELECTROSURGICAL EXCISION PROCEDURE (LEEP) (N/A) as a surgical intervention.  The patient's history has been reviewed, patient examined, no change in status, stable for surgery.  I have reviewed the patient's chart and labs.  Questions were answered to the patient's satisfaction.     Brennan Bailey

## 2022-07-19 NOTE — Anesthesia Postprocedure Evaluation (Signed)
Anesthesia Post Note  Patient: Chelsea [name]  Procedure(s) Performed: LOOP ELECTROSURGICAL EXCISION PROCEDURE (LEEP)  Patient location during evaluation: PACU Anesthesia Type: General Level of consciousness: awake and alert Pain management: pain level controlled Vital Signs Assessment: post-procedure vital signs reviewed and stable Respiratory status: spontaneous breathing, nonlabored ventilation, respiratory function stable and patient connected to nasal cannula oxygen Cardiovascular status: blood pressure returned to baseline and stable Postop Assessment: no apparent nausea or vomiting Anesthetic complications: no   No notable events documented.   Last Vitals:  Vitals:   07/19/22 1145 07/19/22 1158  BP: 135/67 (!) 140/72  Pulse: 78 82  Resp: 15 16  Temp: (!) 36.3 C 36.4 C  SpO2: 97% 100%    Last Pain:  Vitals:   07/19/22 1158  TempSrc: Temporal  PainSc: 0-No pain                 Corinda Gubler

## 2022-07-19 NOTE — Discharge Instructions (Signed)

## 2022-07-19 NOTE — Anesthesia Preprocedure Evaluation (Signed)
Anesthesia Evaluation  Patient identified by MRN, date of birth, ID band Patient awake    Reviewed: Allergy & Precautions, NPO status , Patient's Chart, lab work & pertinent test results  History of Anesthesia Complications Negative for: history of anesthetic complications  Airway Mallampati: III  TM Distance: >3 FB Neck ROM: Full    Dental  (+) Upper Dentures   Pulmonary neg pulmonary ROS, neg sleep apnea, neg COPD, Patient abstained from smoking.Not current smoker,    Pulmonary exam normal breath sounds clear to auscultation       Cardiovascular Exercise Tolerance: Good METShypertension, Pt. on medications (-) CAD and (-) Past MI (-) dysrhythmias  Rhythm:Regular Rate:Normal - Systolic murmurs    Neuro/Psych negative neurological ROS  negative psych ROS   GI/Hepatic neg GERD  ,(+)     (-) substance abuse  ,   Endo/Other  neg diabetes  Renal/GU negative Renal ROS     Musculoskeletal   Abdominal (+) + obese,   Peds  Hematology  (+) Blood dyscrasia, anemia ,   Anesthesia Other Findings Past Medical History: No date: Anemia     Comment:  as a child No date: Diverticulosis No date: Hypertension  Reproductive/Obstetrics                             Anesthesia Physical Anesthesia Plan  ASA: 2  Anesthesia Plan: General   Post-op Pain Management: Ofirmev IV (intra-op)* and Toradol IV (intra-op)*   Induction: Intravenous  PONV Risk Score and Plan: 3 and Ondansetron and Dexamethasone  Airway Management Planned: LMA  Additional Equipment: None  Intra-op Plan:   Post-operative Plan: Extubation in OR  Informed Consent: I have reviewed the patients History and Physical, chart, labs and discussed the procedure including the risks, benefits and alternatives for the proposed anesthesia with the patient or authorized representative who has indicated his/her understanding and acceptance.      Dental advisory given  Plan Discussed with: CRNA and Surgeon  Anesthesia Plan Comments: (Discussed risks of anesthesia with patient, including PONV, sore throat, lip/dental/eye damage. Rare risks discussed as well, such as cardiorespiratory and neurological sequelae, and allergic reactions. Discussed the role of CRNA in patient's perioperative care. Patient understands.)        Anesthesia Quick Evaluation

## 2022-07-19 NOTE — Transfer of Care (Signed)
Immediate Anesthesia Transfer of Care Note  Patient: Chelsea [name]  Procedure(s) Performed: LOOP ELECTROSURGICAL EXCISION PROCEDURE (LEEP)  Patient Location: PACU  Anesthesia Type:General  Level of Consciousness: awake, alert  and oriented  Airway & Oxygen Therapy: Patient Spontanous Breathing and Patient connected to face mask oxygen  Post-op Assessment: Report given to RN and Post -op Vital signs reviewed and stable  Post vital signs: Reviewed and stable  Last Vitals:  Vitals Value Taken Time  BP 121/67   Temp    Pulse 84   Resp 12   SpO2 99     Last Pain:  Vitals:   07/19/22 0936  TempSrc: Temporal  PainSc: 0-No pain         Complications: No notable events documented.

## 2022-07-20 ENCOUNTER — Encounter: Payer: Self-pay | Admitting: Urgent Care

## 2022-07-20 ENCOUNTER — Encounter: Payer: Self-pay | Admitting: Obstetrics and Gynecology

## 2022-07-20 ENCOUNTER — Other Ambulatory Visit: Payer: Self-pay | Admitting: Internal Medicine

## 2022-07-20 DIAGNOSIS — G4709 Other insomnia: Secondary | ICD-10-CM

## 2022-07-20 LAB — SURGICAL PATHOLOGY

## 2022-07-20 NOTE — Telephone Encounter (Signed)
Requested medication (s) are due for refill today: Yes  Requested medication (s) are on the active medication list: Yes  Last refill:  10/26/21  Future visit scheduled: Yes  Notes to clinic:  See request.    Requested Prescriptions  Pending Prescriptions Disp Refills   zaleplon (SONATA) 10 MG capsule [Pharmacy Med Name: ZALEPLON 10 MG CAPSULE] 30 capsule     Sig: Take 1 capsule (10 mg total) by mouth at bedtime as needed for sleep.     Not Delegated - Psychiatry:  Anxiolytics/Hypnotics Failed - 07/20/2022 12:32 AM      Failed - This refill cannot be delegated      Failed - Urine Drug Screen completed in last 360 days      Passed - Valid encounter within last 6 months    Recent Outpatient Visits           1 month ago Annual physical exam   [facility] Reubin Milan, MD   8 months ago Essential hypertension   [facility] Reubin Milan, MD   1 year ago Essential hypertension   [facility] Reubin Milan, MD   1 year ago Essential hypertension   [facility] Reubin Milan, MD   2 years ago Essential hypertension   [facility] Reubin Milan, MD       Future Appointments             In 4 months Judithann Graves Nyoka Cowden, MD [facility]   In 7 months Logan Bores, Ellsworth Lennox, MD [facility]

## 2022-07-20 NOTE — Telephone Encounter (Signed)
Please advise 

## 2022-08-12 ENCOUNTER — Telehealth: Payer: Self-pay | Admitting: *Deleted

## 2022-08-12 NOTE — Chronic Care Management (AMB) (Signed)
  Care Coordination   Note   08/12/2022 Name: CAI FLOTT MRN: [id number] DOB: Apr 16, 1956  LIANI CARIS is a 66 y.o. year old female who sees Reubin Milan, MD for primary care. I reached out to [name] by phone today to offer care coordination services.  Ms. Milos was given information about Care Coordination services today including:   The Care Coordination services include support from the care team which includes your Nurse Coordinator, Clinical Social Worker, or Pharmacist.  The Care Coordination team is here to help remove barriers to the health concerns and goals most important to you. Care Coordination services are voluntary, and the patient may decline or stop services at any time by request to their care team member.   Care Coordination Consent Status: Patient agreed to services and verbal consent obtained.   Follow up plan:  Telephone appointment with care coordination team member scheduled for:  08/13/2022  Encounter Outcome:  Pt. Scheduled  Burman Nieves, CCMA Care Coordination Care Guide Direct Dial: [phone]

## 2022-08-13 ENCOUNTER — Ambulatory Visit: Payer: Self-pay

## 2022-08-13 NOTE — Patient Instructions (Signed)
Visit Information  Thank you for taking time to visit with me today. Please don't hesitate to contact me if I can be of assistance to you.   Following are the goals we discussed today:   Goals Addressed             This Visit's Progress    RNCM: Effective Management of Health and well being for management of chronic conditions       Care Coordination Interventions: Evaluation of current treatment plan related to dysplasia of cervix and patient's adherence to plan as established by provider Advised patient to call the provider for changes in health post procedure, sx and sx of infection, new concerns or questions Provided education to patient re: sx and sx to monitor for, increasing activity level. The patient states she wants to exercise more and her husband goes 3 days a week to work out and she goes to the wellness center. The patient says she is not a morning person but she wants to support him and she goes. Discussed ways to increase activity. Reviewed scheduled/upcoming provider appointments including 11-29-2022 with pcp Discussed plans with patient for ongoing care management follow up and provided patient with direct contact information for care management team Advised patient to discuss changes in her chronic conditions, questions, and concerns with provider Screening for signs and symptoms of depression related to chronic disease state  Assessed social determinant of health barriers 08-13-2022: The patient agrees to [facility] and has contact information for [facility] to call between outreaches if needed Patient interviewed about adult health maintenance status including  Pneumonia Vaccine Influenza Vaccine AWV Advised patient to discuss  AWV with primary care provider   Active listening / Reflection utilized  Emotional Support Provided         Our next appointment is by telephone on 10-22-2022 at 10 am  Please call the care guide team at [phone] if you need to cancel  or reschedule your appointment.   If you are experiencing a Mental Health or [place] or need someone to talk to, please call the Suicide and Crisis Lifeline: 988 call the [place] National Suicide Prevention Lifeline: (301)301-0750 or TTY: 248 807 5392 TTY 936-368-7492) to talk to a trained counselor call 1-800-273-TALK (toll free, 24 hour hotline)  Patient verbalizes understanding of instructions and care plan provided today and agrees to view in [place]. Active MyChart status and patient understanding of how to access instructions and care plan via MyChart confirmed with patient.     Telephone follow up appointment with care management team member scheduled for: 10-22-2022 at 18 am  [place], MSN, [place] [facility] Mobile: [phone]    Exercise Information for Aging Adults Staying physically active is important as you age. Physical activity and exercise can help in maintaining quality of life, health, physical function, and reducing falls. The four types of exercises that are best for older adults are endurance, strength, balance, and flexibility. Contact your health care provider before you start any exercise routine. Ask your health care provider what activities are safe for you. What are the risks? Risks associated with exercising include: Overdoing it. This may lead to sore muscles or fatigue. Falls. Injuries. Dehydration. How to do these exercises Endurance exercises Endurance (aerobic) exercises raise your breathing rate and heart rate. Increasing your endurance helps you do everyday tasks and stay healthy. By improving the health of your body system that includes your heart, lungs, and blood vessels (  circulatory system), you may also delay or prevent diseases such as heart disease, diabetes, and weak bones (osteoporosis). Types of endurance exercises include: Sports. Indoor activities, such as using gym  equipment, doing water aerobics, or dancing. Outdoor activities, such as biking or jogging. Tasks around the house, such as gardening, yard work, and heavy household chores like cleaning. Walking, such as hiking or walking around your neighborhood. When doing endurance exercises, make sure you: Are aware of your surroundings. Use safety equipment as directed. Dress in layers when exercising outdoors. Drink plenty of water to stay well hydrated. Build up endurance slowly. Start with 10 minutes at a time, and gradually build up to doing 30 minutes at a time. Unless your health care provider gave you different instructions, aim to exercise for a total of 150 minutes a week. Spread out that time so you are working on endurance 3 or more days a week. Strength exercises Lifting, pulling, or pushing weights helps to strengthen muscles. Having stronger muscles makes it easier to do everyday activities, such as getting up from a chair, climbing stairs, carrying groceries, and playing with grandchildren. Strength exercises include arm and leg exercises that may be done: With weights. Without weights (using your own body weight). With a resistance band. When doing strength exercises: Move smoothly and steadily. Do not suddenly thrust or jerk the weights, the resistance band, or your body. Start with no weights or with light weights, and gradually add more weight over time. Eventually, aim to use weights that are hard or very hard for you to lift. This means that you are able to do 8 repetitions with the weight, and the last few repetitions are very challenging. Lift or push weights into position for 3 seconds, hold the position for 1 second, and then take 3 seconds to return to your starting position. Breathe out (exhale) during difficult movements, like lifting or pushing weights. Breathe in (inhale) to relax your muscles before the next repetition. Consider alternating arms or legs, especially when you  first start strength exercises. Expect some slight muscle soreness after each session. Do strength exercises on 2 or more days a week, for 30 minutes at a time. Avoid exercising the same muscle groups two days in a row. For example, if you work on your leg muscles one day, work on your arm muscles the next day. When you can do two sets of 10-15 repetitions with a certain weight, increase the amount of weight. Balance exercises Balance exercises can help to prevent falls. Balance exercises include: Standing on one foot. Heel-to-toe walk. Balance walk. Tai chi. Make sure you have something sturdy to hold onto while doing balance exercises, such as a sturdy chair. As your balance improves, challenge yourself by holding on to the chair with one hand instead of two, and then with no hands. Trying exercises with your eyes closed also challenges your balance, but be sure to have a sturdy surface (like a countertop) close by in case you need it. Do balance exercises as often as you want, or as often as directed by your health care provider. Flexibility exercises  Flexibility exercises improve how far you can bend, straighten, move, or rotate parts of your body (range of motion). These exercises also help you do everyday activities such as getting dressed or reaching for objects. Flexibility exercises include stretching different parts of the body, and they may be done in a standing or seated position or on the floor. When stretching, make sure you: Keep  a slight bend in your arms and legs. Avoid completely straightening ("locking") your joints. Do not stretch so far that you feel pain. You should feel a mild stretching feeling. You may try stretching farther as you become more flexible over time. Relax and breathe between stretches. Hold on to something sturdy for balance as needed. Hold each stretch for 10-30 seconds. Repeat each stretch 3-5 times. General safety tips Exercise in well-lit areas. Do  not hold your breath during exercises or stretches. Warm up before exercising, and cool down after exercising. This can help prevent injury. Drink plenty of water during exercise or any activity that makes you sweat. If you are not sure if an exercise is safe for you, or you are not sure how to do an exercise, talk with your health care provider. This is especially important if you have had surgery on muscles, bones, or joints (orthopedic surgery). Where to find more information You can find more information about exercise for older adults from: Your local health department, fitness center, or community center. These facilities may have programs for aging adults. [employer] on Aging: http://kim-miller.com/ National Council on Aging: www.ncoa.org Summary Staying physically active is important as you age. Doing endurance, strength, balance, and flexibility exercises can help in maintaining quality of life, health, physical function, and reducing falls. Make sure to contact your health care provider before you start any exercise routine. Ask your health care provider what activities are safe for you. This information is not intended to replace advice given to you by your health care provider. Make sure you discuss any questions you have with your health care provider. Document Revised: 04/13/2021 Document Reviewed: 04/13/2021 Elsevier Patient Education  [place] [employer].   Physical Activity Log Staying physically active is important for your health. Your health care provider may recommend a physical activity plan that includes: Endurance (aerobic) exercise. Examples include running, swimming, or biking. Strength exercise. Examples include using weights or resistance bands. Balance and flexibility exercise. Examples include using a stability ball, doing yoga or tai chi, or stretching. Contact your health care provider before you start any exercise routine. Ask your health care provider: What  activities are safe for you. How many hours or minutes you should exercise each week. What can be done to stay safe when exercising. Endurance exercise My goal is to do _______________________ hours and _______________________ minutes of endurance exercise each week. Date: _______________________ Activity: _______________________ Total hours or minutes: _______________________ Date: _______________________ Activity: _______________________ Total hours or minutes: _______________________ Date: _______________________ Activity: _______________________ Total hours or minutes: _______________________ Date: _______________________ Activity: _______________________ Total hours or minutes: _______________________ Date: _______________________ Activity: _______________________ Total hours or minutes: _______________________ Date: _______________________ Activity: _______________________ Total hours or minutes: _______________________ Date: _______________________ Activity: _______________________ Total hours or minutes: _______________________ Strength exercise My goal is to do _______________________ hours and _______________________ minutes of strength exercise each week. Date: _______________________ Activity: _______________________ Total hours or minutes: _______________________ Date: _______________________ Activity: _______________________ Total hours or minutes: _______________________ Date: _______________________ Activity: _______________________ Total hours or minutes: _______________________ Date: _______________________ Activity: _______________________ Total hours or minutes: _______________________ Date: _______________________ Activity: _______________________ Total hours or minutes: _______________________ Date: _______________________ Activity: _______________________ Total hours or minutes: _______________________ Date: _______________________ Activity:  _______________________ Total hours or minutes: _______________________ Balance and flexibility exercise My goal is to do _______________________ hours and _______________________ minutes of balance and flexibility exercise each week. Date: _______________________ Activity: _______________________ Total hours or minutes: _______________________ Date: _______________________ Activity: _______________________ Total hours or minutes: _______________________ Date: _______________________ Activity: _______________________  Total hours or minutes: _______________________ Date: _______________________ Activity: _______________________ Total hours or minutes: _______________________ Date: _______________________ Activity: _______________________ Total hours or minutes: _______________________ Date: _______________________ Activity: _______________________ Total hours or minutes: _______________________ Date: _______________________ Activity: _______________________ Total hours or minutes: _______________________ This information is not intended to replace advice given to you by your health care provider. Make sure you discuss any questions you have with your health care provider. Document Revised: 03/27/2021 Document Reviewed: 03/27/2021 Elsevier Patient Education  [place].

## 2022-08-13 NOTE — Patient Outreach (Signed)
  Care Coordination   Initial Visit Note   08/13/2022 Name: Chelsea [name] MRN: [id number] DOB: 07/05/1956  SARABELLA [name] is a 66 y.o. year old female who sees Reubin Milan, MD for primary care. I spoke with  [name] by phone today.  What matters to the patients health and wellness today?  Exercising more and good results from her recent procedure     Goals Addressed             This Visit's Progress    RNCM: Effective Management of Health and well being for management of chronic conditions       Care Coordination Interventions: Evaluation of current treatment plan related to dysplasia of cervix and patient's adherence to plan as established by provider Advised patient to call the provider for changes in health post procedure, sx and sx of infection, new concerns or questions Provided education to patient re: sx and sx to monitor for, increasing activity level. The patient states she wants to exercise more and her husband goes 3 days a week to work out and she goes to the wellness center. The patient says she is not a morning person but she wants to support him and she goes. Discussed ways to increase activity. Reviewed scheduled/upcoming provider appointments including 11-29-2022 with pcp Discussed plans with patient for ongoing care management follow up and provided patient with direct contact information for care management team Advised patient to discuss changes in her chronic conditions, questions, and concerns with provider Screening for signs and symptoms of depression related to chronic disease state  Assessed social determinant of health barriers 08-13-2022: The patient agrees to [facility] and has contact information for the RNCM to call between outreaches if needed Patient interviewed about adult health maintenance status including  Pneumonia Vaccine Influenza Vaccine AWV Advised patient to discuss  AWV with primary care provider   Active listening / Reflection  utilized  Emotional Support Provided         SDOH assessments and interventions completed:  Yes  SDOH Interventions Today    Flowsheet Row Most Recent Value  SDOH Interventions   Food Insecurity Interventions Intervention Not Indicated  Financial Strain Interventions Intervention Not Indicated  Housing Interventions Intervention Not Indicated  Physical Activity Interventions Intervention Not Indicated, Other (Comments)  [goes with her husband when he works out at cardiac rehab and she goes to the wellness center]  Stress Interventions Intervention Not Indicated  Transportation Interventions Intervention Not Indicated        Care Coordination Interventions Activated:  Yes  Care Coordination Interventions:  Yes, provided   Follow up plan: Follow up call scheduled for 10-22-2022 at 10 am    Encounter Outcome:  Pt. Visit Completed   Alto Denver RN, MSN, CCM Community Care Coordinator [facility]  [facility] Mobile: [phone]

## 2022-08-13 NOTE — Patient Outreach (Signed)
  Care Coordination   08/13/2022 Name: Chelsea [name] MRN: [id number] DOB: 12-11-1956   Care Coordination Outreach Attempts:  An unsuccessful telephone outreach was attempted today to offer the patient information about available care coordination services as a benefit of their health plan.   Follow Up Plan:  Additional outreach attempts will be made to offer the patient care coordination information and services.   Encounter Outcome:  No Answer  Care Coordination Interventions Activated:  No   Care Coordination Interventions:  No, not indicated    Alto Denver RN, MSN, CCM Community Care Coordinator [facility]  [facility] Mobile: [phone]

## 2022-08-17 ENCOUNTER — Encounter: Payer: PRIVATE HEALTH INSURANCE | Admitting: Obstetrics and Gynecology

## 2022-08-18 ENCOUNTER — Encounter: Payer: Self-pay | Admitting: Obstetrics and Gynecology

## 2022-08-18 ENCOUNTER — Ambulatory Visit (INDEPENDENT_AMBULATORY_CARE_PROVIDER_SITE_OTHER): Payer: PRIVATE HEALTH INSURANCE | Admitting: Obstetrics and Gynecology

## 2022-08-18 VITALS — BP 128/74 | HR 76 | Ht 60.0 in | Wt 183.6 lb

## 2022-08-18 DIAGNOSIS — N870 Mild cervical dysplasia: Secondary | ICD-10-CM

## 2022-08-18 DIAGNOSIS — Z9889 Other specified postprocedural states: Secondary | ICD-10-CM

## 2022-08-18 NOTE — Progress Notes (Signed)
HPI:      [name] is a 66 y.o. [id number] who LMP was No LMP recorded. Patient is postmenopausal.  Subjective:   She presents today 4 weeks after OR LEEP for persistent CIN-1.  She reports she is doing well.  Has no pain no bleeding.    Hx: The following portions of the patient's history were reviewed and updated as appropriate:             She  has a past medical history of Anemia, Diverticulosis, and Hypertension. She does not have any pertinent problems on file. She  has a past surgical history that includes arm surgery; Cesarean section; Ganglion cyst excision (Right); Trigger finger release (Right); Colonoscopy; Tonsillectomy; Knee arthroscopy with medial menisectomy (Left, 04/22/2016); Breast biopsy (Right, 2015); Breast excisional biopsy (Right, 2015); and LEEP (N/A, 07/19/2022). Her family history includes Breast cancer in her mother; Diabetes in her father; Heart attack (age of onset: 60) in her mother; Kidney disease in her father. She  reports that she has never smoked. She has never been exposed to tobacco smoke. She has never used smokeless tobacco. She reports that she does not drink alcohol and does not use drugs. She has a current medication list which includes the following prescription(s): amlodipine, cetirizine, vitamin d3, multivitamin, omega-3 fatty acids, triamterene-hydrochlorothiazide, zaleplon, and [DISCONTINUED] hydrochlorothiazide. She has No Known Allergies.       Review of Systems:  Review of Systems  Constitutional: Denied constitutional symptoms, night sweats, recent illness, fatigue, fever, insomnia and weight loss.  Eyes: Denied eye symptoms, eye pain, photophobia, vision change and visual disturbance.  Ears/Nose/Throat/Neck: Denied ear, nose, throat or neck symptoms, hearing loss, nasal discharge, sinus congestion and sore throat.  Cardiovascular: Denied cardiovascular symptoms, arrhythmia, chest pain/pressure, edema, exercise intolerance, orthopnea and  palpitations.  Respiratory: Denied pulmonary symptoms, asthma, pleuritic pain, productive sputum, cough, dyspnea and wheezing.  Gastrointestinal: Denied, gastro-esophageal reflux, melena, nausea and vomiting.  Genitourinary: Denied genitourinary symptoms including symptomatic vaginal discharge, pelvic relaxation issues, and urinary complaints.  Musculoskeletal: Denied musculoskeletal symptoms, stiffness, swelling, muscle weakness and myalgia.  Dermatologic: Denied dermatology symptoms, rash and scar.  Neurologic: Denied neurology symptoms, dizziness, headache, neck pain and syncope.  Psychiatric: Denied psychiatric symptoms, anxiety and depression.  Endocrine: Denied endocrine symptoms including hot flashes and night sweats.   Meds:   Current Outpatient Medications on File Prior to Visit  Medication Sig Dispense Refill   amLODipine (NORVASC) 10 MG tablet Take 1 tablet (10 mg total) by mouth daily. 90 tablet 1   cetirizine (ZYRTEC) 10 MG tablet Take 10 mg by mouth daily.     Cholecalciferol (VITAMIN D3) 5000 units TABS Take 1 tablet by mouth daily.     Multiple Vitamin (MULTIVITAMIN) tablet Take 1 tablet by mouth daily.     Omega-3 Fatty Acids (FISH OIL PO) Take 1 tablet by mouth daily.     triamterene-hydrochlorothiazide (MAXZIDE-25) 37.5-25 MG tablet Take 1 tablet by mouth daily. 90 tablet 1   zaleplon (SONATA) 10 MG capsule TAKE 1 CAPSULE (10 MG TOTAL) BY MOUTH AT BEDTIME AS NEEDED FOR SLEEP. 30 capsule 0   [DISCONTINUED] hydrochlorothiazide (HYDRODIURIL) 25 MG tablet Take 25 mg by mouth daily.     No current facility-administered medications on file prior to visit.      Objective:     Vitals:   08/18/22 1006  BP: 128/74  Pulse: 76   Filed Weights   08/18/22 1006  Weight: 183 lb 9.6 oz (83.3 kg)  CIN-1 noted at the LEEP          Assessment:    [id number] Patient Active Problem List   Diagnosis Date Noted   Mild hyperlipidemia 05/27/2022   Other insomnia  05/01/2020   Essential hypertension 12/28/2019   Dysplasia of cervix, low grade (CIN 1) 12/28/2019   Carpal tunnel syndrome 05/30/2014     1. Postoperative state   2. CIN I (cervical intraepithelial neoplasia I)     Patient doing well postop.   Plan:            1.  Discussed pathology findings.  Questions answered.  2.  May resume normal activities in 2 weeks including intercourse.  3.  Plan follow-up in 5 months for first Pap after LEEP. Orders No orders of the defined types were placed in this encounter.   No orders of the defined types were placed in this encounter.     F/U  Return in about 5 months (around 01/18/2023).  Elonda Husky, M.D. 08/18/2022 10:37 AM

## 2022-08-18 NOTE — Progress Notes (Signed)
Patient presents today for LEEP follow-up. She states no pain, bleeding or discomfort since procedure. No other concerns than results.

## 2022-08-20 DIAGNOSIS — H903 Sensorineural hearing loss, bilateral: Secondary | ICD-10-CM | POA: Diagnosis not present

## 2022-09-23 DIAGNOSIS — M1712 Unilateral primary osteoarthritis, left knee: Secondary | ICD-10-CM | POA: Diagnosis not present

## 2022-10-22 ENCOUNTER — Ambulatory Visit: Payer: Self-pay | Admitting: *Deleted

## 2022-10-22 NOTE — Patient Instructions (Signed)
Visit Information  Thank you for taking time to visit with me today. Please don't hesitate to contact me if I can be of assistance to you before our next scheduled telephone appointment.  Following are the goals we discussed today:  Continue working out at the gym Continue monitoring blood pressure at least twice weekly  Please call the Suicide and Crisis Lifeline: 988 call the [place] National Suicide Prevention Lifeline: 2292595588 or TTY: 825-447-3167 TTY 931-802-8298) to talk to a trained counselor call 1-800-273-TALK (toll free, 24 hour hotline) call 911 if you are experiencing a Mental Health or Behavioral Health Crisis or need someone to talk to.  Patient verbalizes understanding of instructions and care plan provided today and agrees to view in MyChart. Active MyChart status and patient understanding of how to access instructions and care plan via MyChart confirmed with patient.     The patient has been provided with contact information for the care management team and has been advised to call with any health related questions or concerns.   Kemper Durie, RN, MSN, [facility] Care Management Care Management Coordinator [phone]

## 2022-10-22 NOTE — Patient Outreach (Signed)
  Care Coordination   Follow Up Visit Note   10/22/2022 Name: Chelsea [name] MRN: [id number] DOB: March 07, 1956  Chelsea [name] is a 66 y.o. year old female who sees Chelsea Milan, MD for primary care. I spoke with  Chelsea [name] by phone today.  What matters to the patients health and wellness today?  Joined gym, increasing activity, and state blood pressure is "good."  No further needs at this time.  Denies any urgent concerns, encouraged to contact this care manager with questions.      Goals Addressed             This Visit's Progress    COMPLETED: RNCM: Effective Management of Health and well being for management of chronic conditions   On track    Care Coordination Interventions: Evaluation of current treatment plan related to dysplasia of cervix and patient's adherence to plan as established by provider Advised patient to call the provider for changes in health post procedure, sx and sx of infection, new concerns or questions Provided education to patient re: sx and sx to monitor for, increasing activity level. The patient states she wants to exercise more and her husband goes 3 days a week to work out and she goes to the wellness center. The patient says she is not a morning person but she wants to support him and she goes. Discussed ways to increase activity. Reviewed scheduled/upcoming provider appointments including 11-29-2022 with pcp Discussed plans with patient for ongoing care management follow up and provided patient with direct contact information for care management team Advised patient to discuss changes in her chronic conditions, questions, and concerns with provider Screening for signs and symptoms of depression related to chronic disease state  Assessed social determinant of health barriers 10-22-2022: The patient agrees to Chelsea [name] and has contact information for the RNCM to call between outreaches if needed Patient interviewed about adult health maintenance  status including  Pneumonia Vaccine Influenza Vaccine AWV Advised patient to discuss  AWV with primary care provider   Active listening / Reflection utilized  Emotional Support Provided         SDOH assessments and interventions completed:  No     Care Coordination Interventions Activated:  Yes  Care Coordination Interventions:  Yes, provided   Follow up plan: No further intervention required.   Encounter Outcome:  Pt. Visit Completed   Kemper Durie, RN, MSN, [facility] Care Management Care Management Coordinator [phone]

## 2022-11-01 DIAGNOSIS — H2513 Age-related nuclear cataract, bilateral: Secondary | ICD-10-CM | POA: Diagnosis not present

## 2022-11-01 DIAGNOSIS — H43813 Vitreous degeneration, bilateral: Secondary | ICD-10-CM | POA: Diagnosis not present

## 2022-11-22 DIAGNOSIS — E669 Obesity, unspecified: Secondary | ICD-10-CM | POA: Diagnosis not present

## 2022-11-22 DIAGNOSIS — G4700 Insomnia, unspecified: Secondary | ICD-10-CM | POA: Diagnosis not present

## 2022-11-22 DIAGNOSIS — I10 Essential (primary) hypertension: Secondary | ICD-10-CM | POA: Diagnosis not present

## 2022-11-22 DIAGNOSIS — R32 Unspecified urinary incontinence: Secondary | ICD-10-CM | POA: Diagnosis not present

## 2022-11-29 ENCOUNTER — Encounter: Payer: Self-pay | Admitting: Internal Medicine

## 2022-11-29 ENCOUNTER — Ambulatory Visit (INDEPENDENT_AMBULATORY_CARE_PROVIDER_SITE_OTHER): Payer: PPO | Admitting: Internal Medicine

## 2022-11-29 VITALS — BP 128/66 | HR 74 | Ht 60.0 in | Wt 184.0 lb

## 2022-11-29 DIAGNOSIS — I10 Essential (primary) hypertension: Secondary | ICD-10-CM

## 2022-11-29 DIAGNOSIS — Z23 Encounter for immunization: Secondary | ICD-10-CM | POA: Diagnosis not present

## 2022-11-29 DIAGNOSIS — N870 Mild cervical dysplasia: Secondary | ICD-10-CM

## 2022-11-29 NOTE — Progress Notes (Signed)
Date:  11/29/2022   Name:  Chelsea [name]   DOB:  22-Nov-1956   MRN:  [id number]   Chief Complaint: Hypertension  Hypertension This is a chronic problem. The problem is controlled. Pertinent negatives include no chest pain, headaches, palpitations or shortness of breath. Past treatments include calcium channel blockers and diuretics.    Lab Results  Component Value Date   NA 145 (H) 05/27/2022   K 3.4 (L) 05/27/2022   CO2 27 05/27/2022   GLUCOSE 96 05/27/2022   BUN 17 05/27/2022   CREATININE 0.88 05/27/2022   CALCIUM 9.6 05/27/2022   EGFR 73 05/27/2022   GFRNONAA 77 12/28/2019   Lab Results  Component Value Date   CHOL 180 03/09/2022   HDL 50 03/09/2022   LDLCALC 105 (H) 03/09/2022   TRIG 141 03/09/2022   CHOLHDL 3.6 03/09/2022   Lab Results  Component Value Date   TSH 1.650 03/09/2022   Lab Results  Component Value Date   HGBA1C 5.4 03/09/2022   Lab Results  Component Value Date   WBC 6.7 07/16/2022   HGB 11.2 (L) 07/16/2022   HCT 36.5 07/16/2022   MCV 81.8 07/16/2022   PLT 307 07/16/2022   Lab Results  Component Value Date   ALT 29 05/27/2022   AST 28 05/27/2022   ALKPHOS 140 (H) 05/27/2022   BILITOT 0.4 05/27/2022   No results found for: "25OHVITD2", "25OHVITD3", "VD25OH"   Review of Systems  Constitutional:  Negative for fatigue and unexpected weight change.  HENT:  Negative for nosebleeds.   Eyes:  Negative for visual disturbance.  Respiratory:  Negative for cough, chest tightness, shortness of breath and wheezing.   Cardiovascular:  Positive for leg swelling. Negative for chest pain and palpitations.  Gastrointestinal:  Negative for abdominal pain, constipation and diarrhea.  Musculoskeletal:  Positive for myalgias (anterior leg pain).  Neurological:  Negative for dizziness, weakness, light-headedness and headaches.  Psychiatric/Behavioral:  Negative for dysphoric mood and sleep disturbance. The patient is not nervous/anxious.     Patient  Active Problem List   Diagnosis Date Noted   Mild hyperlipidemia 05/27/2022   Other insomnia 05/01/2020   Essential hypertension 12/28/2019   Dysplasia of cervix, low grade (CIN 1) 12/28/2019   Carpal tunnel syndrome 05/30/2014    No Known Allergies  Past Surgical History:  Procedure Laterality Date   arm surgery     BREAST BIOPSY Right 2015   papilloma   BREAST EXCISIONAL BIOPSY Right 2015   neg   CESAREAN SECTION     COLONOSCOPY     GANGLION CYST EXCISION Right    KNEE ARTHROSCOPY WITH MEDIAL MENISECTOMY Left 04/22/2016   Procedure: KNEE ARTHROSCOPY WITH MEDIAL MENISECTOMY;  Surgeon: Hessie Knows, MD;  Location: [facility] ORS;  Service: Orthopedics;  Laterality: Left;   LEEP N/A 07/19/2022   Procedure: LOOP ELECTROSURGICAL EXCISION PROCEDURE (LEEP);  Surgeon: Harlin Heys, MD;  Location: [facility] ORS;  Service: Gynecology;  Laterality: N/A;   TONSILLECTOMY     TRIGGER FINGER RELEASE Right     Social History   Tobacco Use   Smoking status: Never    Passive exposure: Never   Smokeless tobacco: Never  Vaping Use   Vaping Use: Never used  Substance Use Topics   Alcohol use: No   Drug use: No     Medication list has been reviewed and updated.  Current Meds  Medication Sig   amLODipine (NORVASC) 10 MG tablet Take 1 tablet (10 mg total) by  mouth daily.   cetirizine (ZYRTEC) 10 MG tablet Take 10 mg by mouth daily.   Cholecalciferol (VITAMIN D3) 5000 units TABS Take 1 tablet by mouth daily.   Multiple Vitamin (MULTIVITAMIN) tablet Take 1 tablet by mouth daily.   Omega-3 Fatty Acids (FISH OIL PO) Take 1 tablet by mouth daily.   triamterene-hydrochlorothiazide (MAXZIDE-25) 37.5-25 MG tablet Take 1 tablet by mouth daily.   zaleplon (SONATA) 10 MG capsule TAKE 1 CAPSULE (10 MG TOTAL) BY MOUTH AT BEDTIME AS NEEDED FOR SLEEP.       05/27/2022    8:28 AM 10/26/2021    8:24 AM 03/26/2021    8:49 AM 11/25/2020    2:45 PM  GAD 7 : Generalized Anxiety Score  Nervous, Anxious, on  Edge 0 0 0 0  Control/stop worrying 0 0 0 0  Worry too much - different things 0 0 0 0  Trouble relaxing 0 0 0 0  Restless 0 0 0 0  Easily annoyed or irritable 0 0 0 0  Afraid - awful might happen 0 0 0 0  Total GAD 7 Score 0 0 0 0  Anxiety Difficulty Not difficult at all Not difficult at all         08/13/2022   10:59 AM 05/27/2022    8:28 AM 10/26/2021    8:24 AM  Depression screen PHQ 2/9  Decreased Interest 0 0 0  Down, Depressed, Hopeless 0 0 0  PHQ - 2 Score 0 0 0  Altered sleeping  0 0  Tired, decreased energy  0 1  Change in appetite  0 0  Feeling bad or failure about yourself   0 0  Trouble concentrating  0 0  Moving slowly or fidgety/restless  0 0  Suicidal thoughts  0 0  PHQ-9 Score  0 1  Difficult doing work/chores  Not difficult at all Not difficult at all    BP Readings from Last 3 Encounters:  11/29/22 136/64  08/18/22 128/74  07/19/22 (!) 140/72    Physical Exam Vitals and nursing note reviewed.  Constitutional:      General: She is not in acute distress.    Appearance: Normal appearance. She is well-developed.  HENT:     Head: Normocephalic and atraumatic.  Cardiovascular:     Rate and Rhythm: Normal rate and regular rhythm.     Heart sounds: No murmur heard. Pulmonary:     Effort: Pulmonary effort is normal. No respiratory distress.     Breath sounds: No wheezing or rhonchi.  Musculoskeletal:        General: Swelling (trace anterior tibia edema bilaterally) present.     Cervical back: Normal range of motion.  Lymphadenopathy:     Cervical: No cervical adenopathy.  Skin:    General: Skin is warm and dry.     Findings: No rash.  Neurological:     Mental Status: She is alert and oriented to person, place, and time.  Psychiatric:        Mood and Affect: Mood normal.        Behavior: Behavior normal.     Wt Readings from Last 3 Encounters:  11/29/22 184 lb (83.5 kg)  08/18/22 183 lb 9.6 oz (83.3 kg)  07/19/22 184 lb (83.5 kg)    BP  136/64   Pulse 74   Ht 5' (1.524 m)   Wt 184 lb (83.5 kg)   SpO2 95%   BMI 35.94 kg/m   Assessment and Plan: 1.  Essential hypertension Clinically stable exam with well controlled BP. Tolerating medications without side effects at this time. Mild edema may be managed with increasing water intake and limiting sodium. Pt to continue current regimen and low sodium diet; benefits of regular exercise as able discussed. Will consider alternative to Norvasc if symptoms continue.  2. Dysplasia of cervix, low grade (CIN 1) S/p LEEP Will follow up with GYN  3. Need for immunization against influenza - Flu Vaccine QUAD High Dose(Fluad)  4. Need for pneumococcal 20-valent conjugate vaccination - Pneumococcal conjugate vaccine 20-valent (Prevnar 20)   Partially dictated using Editor, commissioning. Any errors are unintentional.  Halina Maidens, MD [facility]  11/29/2022

## 2022-12-01 ENCOUNTER — Encounter: Payer: Self-pay | Admitting: Internal Medicine

## 2022-12-01 NOTE — Telephone Encounter (Signed)
Pt response.  KP

## 2022-12-01 NOTE — Telephone Encounter (Signed)
Please review.  KP

## 2022-12-07 NOTE — Telephone Encounter (Signed)
Please review.  KP

## 2022-12-11 ENCOUNTER — Other Ambulatory Visit: Payer: Self-pay | Admitting: Internal Medicine

## 2022-12-11 DIAGNOSIS — I10 Essential (primary) hypertension: Secondary | ICD-10-CM

## 2022-12-12 NOTE — Telephone Encounter (Signed)
Requested Prescriptions  Pending Prescriptions Disp Refills   triamterene-hydrochlorothiazide (MAXZIDE-25) 37.5-25 MG tablet [Pharmacy Med Name: TRIAMTERENE-HCTZ 37.5-25 MG TB] 30 tablet 5    Sig: TAKE 1 TABLET BY MOUTH EVERY DAY     Cardiovascular: Diuretic Combos Failed - 12/11/2022  8:57 AM      Failed - K in normal range and within 180 days    Potassium  Date Value Ref Range Status  05/27/2022 3.4 (L) 3.5 - 5.2 mmol/L Final  11/22/2014 4.1 3.5 - 5.1 mmol/L Final         Failed - Na in normal range and within 180 days    Sodium  Date Value Ref Range Status  05/27/2022 145 (H) 134 - 144 mmol/L Final  11/22/2014 141 136 - 145 mmol/L Final         Failed - Cr in normal range and within 180 days    Creatinine  Date Value Ref Range Status  11/22/2014 1.02 0.60 - 1.30 mg/dL Final   Creatinine, Ser  Date Value Ref Range Status  05/27/2022 0.88 0.57 - 1.00 mg/dL Final         Passed - Last BP in normal range    BP Readings from Last 1 Encounters:  11/29/22 128/66         Passed - Valid encounter within last 6 months    Recent Outpatient Visits           1 week ago Essential hypertension   [place] [facility] at [facility], Nyoka Cowden, MD   6 months ago Annual physical exam   [place] [facility] at [facility], Nyoka Cowden, MD   1 year ago Essential hypertension   [place] [facility] at [facility], Nyoka Cowden, MD   1 year ago Essential hypertension   [place] [facility] at [facility], Nyoka Cowden, MD   2 years ago Essential hypertension   [place] [facility] at [facility], Nyoka Cowden, MD       Future Appointments             In 2 months Logan Bores, Ellsworth Lennox, MD [place] [facility]   In 5 months Reubin Milan, MD [facility] at [facility]              amLODipine (NORVASC) 10 MG tablet [Pharmacy Med Name: AMLODIPINE BESYLATE 10 MG TAB] 30 tablet 5    Sig: TAKE 1 TABLET BY MOUTH EVERY DAY     Cardiovascular: Calcium Channel Blockers 2 Passed - 12/11/2022  8:57 AM      Passed - Last BP in normal range    BP Readings from Last 1 Encounters:  11/29/22 128/66         Passed - Last Heart Rate in normal range    Pulse Readings from Last 1 Encounters:  11/29/22 74         Passed - Valid encounter within last 6 months    Recent Outpatient Visits           1 week ago Essential hypertension    Primary Care and Sports Medicine at [facility], Nyoka Cowden, MD   6 months ago Annual physical exam   [facility] at [facility], Nyoka Cowden, MD   1 year ago  Essential hypertension   [place] [facility] at [facility], Nyoka Cowden, MD   1 year ago Essential hypertension   [place] [facility] at [facility], Nyoka Cowden, MD   2 years ago Essential hypertension    Primary Care and Sports Medicine at [facility], Nyoka Cowden, MD       Future Appointments             In 2 months Logan Bores, Ellsworth Lennox, MD [place] [facility]   In 5 months Judithann Graves Nyoka Cowden, MD [facility] Primary Care and Sports Medicine at [facility], [facility]

## 2023-03-11 ENCOUNTER — Encounter: Payer: Self-pay | Admitting: Obstetrics and Gynecology

## 2023-03-23 ENCOUNTER — Ambulatory Visit: Payer: PPO | Admitting: Obstetrics and Gynecology

## 2023-03-23 ENCOUNTER — Ambulatory Visit (INDEPENDENT_AMBULATORY_CARE_PROVIDER_SITE_OTHER): Payer: PPO

## 2023-03-23 VITALS — Ht 60.0 in

## 2023-03-23 DIAGNOSIS — Z124 Encounter for screening for malignant neoplasm of cervix: Secondary | ICD-10-CM

## 2023-03-23 DIAGNOSIS — Z01419 Encounter for gynecological examination (general) (routine) without abnormal findings: Secondary | ICD-10-CM

## 2023-03-23 DIAGNOSIS — Z1231 Encounter for screening mammogram for malignant neoplasm of breast: Secondary | ICD-10-CM

## 2023-03-23 DIAGNOSIS — Z0000 Encounter for general adult medical examination without abnormal findings: Secondary | ICD-10-CM | POA: Diagnosis not present

## 2023-03-23 DIAGNOSIS — N870 Mild cervical dysplasia: Secondary | ICD-10-CM

## 2023-03-23 NOTE — Progress Notes (Signed)
Subjective:   Chelsea [name] is a 67 y.o. female who presents for Medicare Annual (Subsequent) preventive examination.  Review of Systems    I connected with  Chelsea [name] on 03/23/23 by a audio enabled telemedicine application and verified that I am speaking with the correct Chelsea [name] using two identifiers.  Patient Location: Home  Provider Location: Office/Clinic  I discussed the limitations of evaluation and management by telemedicine. The patient expressed understanding and agreed to proceed.        Objective:    Today's Vitals   03/23/23 1343  Height: 5' (1.524 m)   Body mass index is 35.94 kg/m.     07/19/2022    9:37 AM 07/16/2022    8:45 AM  Advanced Directives  Does Patient Have a Medical Advance Directive? No No    Current Medications (verified) Outpatient Encounter Medications as of 03/23/2023  Medication Sig   amLODipine (NORVASC) 10 MG tablet TAKE 1 TABLET BY MOUTH EVERY DAY   cetirizine (ZYRTEC) 10 MG tablet Take 10 mg by mouth daily.   Cholecalciferol (VITAMIN D3) 5000 units TABS Take 1 tablet by mouth daily.   Multiple Vitamin (MULTIVITAMIN) tablet Take 1 tablet by mouth daily.   Omega-3 Fatty Acids (FISH OIL PO) Take 1 tablet by mouth daily.   triamterene-hydrochlorothiazide (MAXZIDE-25) 37.5-25 MG tablet TAKE 1 TABLET BY MOUTH EVERY DAY   zaleplon (SONATA) 10 MG capsule TAKE 1 CAPSULE (10 MG TOTAL) BY MOUTH AT BEDTIME AS NEEDED FOR SLEEP.   [DISCONTINUED] hydrochlorothiazide (HYDRODIURIL) 25 MG tablet Take 25 mg by mouth daily.   No facility-administered encounter medications on file as of 03/23/2023.    Allergies (verified) Patient has no known allergies.   History: Past Medical History:  Diagnosis Date   Anemia    as a child   Diverticulosis    Hypertension    Past Surgical History:  Procedure Laterality Date   arm surgery     BREAST BIOPSY Right 2015   papilloma   BREAST EXCISIONAL BIOPSY Right 2015   neg   CESAREAN SECTION      COLONOSCOPY     GANGLION CYST EXCISION Right    KNEE ARTHROSCOPY WITH MEDIAL MENISECTOMY Left 04/22/2016   Procedure: KNEE ARTHROSCOPY WITH MEDIAL MENISECTOMY;  Surgeon: Kennedy BuckerMichael Menz, MD;  Location: [facility] ORS;  Service: Orthopedics;  Laterality: Left;   LEEP N/A 07/19/2022   Procedure: LOOP ELECTROSURGICAL EXCISION PROCEDURE (LEEP);  Surgeon: Linzie CollinEvans, David James, MD;  Location: [facility] ORS;  Service: Gynecology;  Laterality: N/A;   TONSILLECTOMY     TRIGGER FINGER RELEASE Right    Family History  Problem Relation Age of Onset   Heart attack Mother [id number]   Breast cancer Mother    Kidney disease Father    Diabetes Father    Social History   Socioeconomic History   Marital status: Married    Spouse name: Chelsea [name]   Number of children: 2   Years of education: Not on file   Highest education level: Not on file  Occupational History   Not on file  Tobacco Use   Smoking status: Never    Passive exposure: Never   Smokeless tobacco: Never  Vaping Use   Vaping Use: Never used  Substance and Sexual Activity   Alcohol use: No   Drug use: No   Sexual activity: Yes    Birth control/protection: Post-menopausal  Other Topics Concern   Not on file  Social History Narrative   Not on file  Social Determinants of Health   Financial Resource Strain: Low Risk  (08/13/2022)   Overall Financial Resource Strain (CARDIA)    Difficulty of Paying Living Expenses: Not hard at all  Food Insecurity: No Food Insecurity (08/13/2022)   Hunger Vital Sign    Worried About Running Out of Food in the Last Year: Never true    Ran Out of Food in the Last Year: Never true  Transportation Needs: No Transportation Needs (08/13/2022)   PRAPARE - Administrator, Civil Service (Medical): No    Lack of Transportation (Non-Medical): No  Physical Activity: Insufficiently Active (08/13/2022)   Exercise Vital Sign    Days of Exercise per Week: 3 days    Minutes of Exercise per Session: 30 min  Stress: No Stress  Concern Present (08/13/2022)   [employer] of Occupational Health - Occupational Stress Questionnaire    Feeling of Stress : Not at all  Social Connections: Not on file    Tobacco Counseling Counseling given: Not Answered   Clinical Intake:                 Diabetic?NO         Activities of Daily Living    07/16/2022    8:47 AM 07/16/2022    8:44 AM  In your present state of health, do you have any difficulty performing the following activities:  Hearing?  0  Vision?  1  Comment  glasses  Difficulty concentrating or making decisions?  0  Walking or climbing stairs?  0  Dressing or bathing?  0  Doing errands, shopping? 0     Patient Care Team: Reubin Milan, MD as PCP - General (Internal Medicine) Linzie Collin, MD as Consulting Physician (Obstetrics and Gynecology) Kennedy Bucker, MD as Consulting Physician (Orthopedic Surgery) Kemper Durie, RN as [facility] Care Management  Indicate any recent Medical Services you may have received from other than [facility] providers in the past year (date may be approximate).     Assessment:   This is a routine wellness examination for [facility].  Hearing/Vision screen No results found.  Dietary issues and exercise activities discussed:     Goals Addressed   None   Depression Screen    11/29/2022    8:17 AM 08/13/2022   10:59 AM 05/27/2022    8:28 AM 10/26/2021    8:24 AM 03/26/2021    8:48 AM 11/25/2020    2:45 PM 05/01/2020    9:30 AM  PHQ 2/9 Scores  PHQ - 2 Score 0 0 0 0 0 0 0  PHQ- 9 Score 0  0 1 0 0 2    Fall Risk    11/29/2022    8:17 AM 05/27/2022    8:28 AM 10/26/2021    8:25 AM 03/26/2021    8:48 AM 11/25/2020    2:45 PM  Fall Risk   Falls in the past year? 0 0 0 0 0  Number falls in past yr: 0 0 0    Injury with Fall? 0 0 0    Risk for fall due to : No Fall Risks No Fall Risks No Fall Risks    Follow up Falls evaluation completed Falls evaluation completed Falls evaluation  completed Falls evaluation completed Falls evaluation completed    FALL RISK PREVENTION PERTAINING TO THE HOME:  Any stairs in or around the home? No  If so, are there any without handrails? No  Home free of loose throw  rugs in walkways, pet beds, electrical cords, etc? Yes  Adequate lighting in your home to reduce risk of falls? Yes   ASSISTIVE DEVICES UTILIZED TO PREVENT FALLS:  Life alert? No  Use of a cane, walker or w/c? No  Grab bars in the bathroom? No  Shower chair or bench in shower? No  Elevated toilet seat or a handicapped toilet? No   TIMED UP AND GO:  Was the test performed? No .  Length of time to ambulate 10 feet: N/A sec.     Cognitive Function:        Immunizations Immunization History  Administered Date(s) Administered   Fluad Quad(high Dose 65+) 11/29/2022   Influenza,inj,Quad PF,6+ Mos 10/09/2019, 10/14/2020, 10/26/2021   Influenza-Unspecified 09/09/2016, 09/28/2017, 09/26/2018   Moderna Sars-Covid-2 Vaccination 07/17/2020, 08/14/2020   PNEUMOCOCCAL CONJUGATE-20 11/29/2022   Tdap 05/27/2022    TDAP status: Up to date  Flu Vaccine status: Up to date  Pneumococcal vaccine status: Up to date  Covid-19 vaccine status: Completed vaccines  Qualifies for Shingles Vaccine? Yes   Zostavax completed No   Shingrix Completed?: No.    Education has been provided regarding the importance of this vaccine. Patient has been advised to call insurance company to determine out of pocket expense if they have not yet received this vaccine. Advised may also receive vaccine at local pharmacy or Health Dept. Verbalized acceptance and understanding.  Screening Tests Health Maintenance  Topic Date Due   Zoster Vaccines- Shingrix (1 of 2) Never done   DEXA SCAN  Never done   COVID-19 Vaccine (3 - 2023-24 season) 08/13/2022   HIV Screening  05/28/2023 (Originally 05/30/1971)   MAMMOGRAM  06/11/2023   INFLUENZA VACCINE  07/14/2023   Medicare Annual Wellness (AWV)   03/22/2024   PAP SMEAR-Modifier  03/09/2025   COLONOSCOPY (Pts 45-37yrs Insurance coverage will need to be confirmed)  08/19/2025   DTaP/Tdap/Td (2 - Td or Tdap) 05/27/2032   Pneumonia Vaccine 35+ Years old  Completed   Hepatitis C Screening  Completed   HPV VACCINES  Aged Out    Health Maintenance  Health Maintenance Due  Topic Date Due   Zoster Vaccines- Shingrix (1 of 2) Never done   DEXA SCAN  Never done   COVID-19 Vaccine (3 - 2023-24 season) 08/13/2022    Colorectal cancer screening: Type of screening: Colonoscopy. Completed 08/19/2020. Repeat every 10 years  Mammogram status: Completed 06/10/2022. Repeat every year    Lung Cancer Screening: (Low Dose CT Chest recommended if Age 66-80 years, 30 pack-year currently smoking OR have quit w/in 15years.) does not qualify.   Lung Cancer Screening Referral: NO  Additional Screening:  Hepatitis C Screening: does qualify; Completed 12/28/2019  Vision Screening: Recommended annual ophthalmology exams for early detection of glaucoma and other disorders of the eye. Is the patient up to date with their annual eye exam?  Yes  Who is the provider or what is the name of the office in which the patient attends annual eye exams?  [facility] If pt is not established with a provider, would they like to be referred to a provider to establish care? No .   Dental Screening: Recommended annual dental exams for proper oral hygiene  Community Resource Referral / Chronic Care Management: CRR required this visit?  No   CCM required this visit?  No      Plan:     I have personally reviewed and noted the following in the patient's chart:   Medical and social  history Use of alcohol, tobacco or illicit drugs  Current medications and supplements including opioid prescriptions. Patient is not currently taking opioid prescriptions. Functional ability and status Nutritional status Physical activity Advanced directives List of other  physicians Hospitalizations, surgeries, and ER visits in previous 12 months Vitals Screenings to include cognitive, depression, and falls Referrals and appointments  In addition, I have reviewed and discussed with patient certain preventive protocols, quality metrics, and best practice recommendations. A written personalized care plan for preventive services as well as general preventive health recommendations were provided to patient.     Eulis Canner Datrell Dunton, CMA   03/23/2023   Nurse Notes: none

## 2023-04-05 ENCOUNTER — Ambulatory Visit: Payer: PPO | Admitting: Obstetrics and Gynecology

## 2023-04-05 DIAGNOSIS — N870 Mild cervical dysplasia: Secondary | ICD-10-CM

## 2023-04-05 DIAGNOSIS — Z1231 Encounter for screening mammogram for malignant neoplasm of breast: Secondary | ICD-10-CM

## 2023-04-05 DIAGNOSIS — Z124 Encounter for screening for malignant neoplasm of cervix: Secondary | ICD-10-CM

## 2023-04-05 DIAGNOSIS — Z01419 Encounter for gynecological examination (general) (routine) without abnormal findings: Secondary | ICD-10-CM

## 2023-04-15 DIAGNOSIS — G4700 Insomnia, unspecified: Secondary | ICD-10-CM | POA: Diagnosis not present

## 2023-04-15 DIAGNOSIS — R32 Unspecified urinary incontinence: Secondary | ICD-10-CM | POA: Diagnosis not present

## 2023-04-15 DIAGNOSIS — I10 Essential (primary) hypertension: Secondary | ICD-10-CM | POA: Diagnosis not present

## 2023-04-15 DIAGNOSIS — E669 Obesity, unspecified: Secondary | ICD-10-CM | POA: Diagnosis not present

## 2023-05-23 ENCOUNTER — Other Ambulatory Visit: Payer: Self-pay | Admitting: Obstetrics and Gynecology

## 2023-05-23 DIAGNOSIS — Z1231 Encounter for screening mammogram for malignant neoplasm of breast: Secondary | ICD-10-CM

## 2023-05-31 ENCOUNTER — Ambulatory Visit: Payer: PPO | Admitting: Internal Medicine

## 2023-06-01 ENCOUNTER — Ambulatory Visit: Payer: PPO | Admitting: Obstetrics and Gynecology

## 2023-06-03 ENCOUNTER — Ambulatory Visit (INDEPENDENT_AMBULATORY_CARE_PROVIDER_SITE_OTHER): Payer: PPO | Admitting: Internal Medicine

## 2023-06-03 VITALS — BP 128/64 | HR 60 | Ht 60.0 in | Wt 175.0 lb

## 2023-06-03 DIAGNOSIS — R079 Chest pain, unspecified: Secondary | ICD-10-CM

## 2023-06-03 DIAGNOSIS — I10 Essential (primary) hypertension: Secondary | ICD-10-CM

## 2023-06-03 NOTE — Progress Notes (Signed)
Date:  06/03/2023   Name:  Chelsea [name]   DOB:  07-Aug-1956   MRN:  [id number]   Chief Complaint: Hypertension  Hypertension This is a chronic problem. The problem is controlled. Associated symptoms include chest pain. Pertinent negatives include no headaches, palpitations or shortness of breath. Past treatments include calcium channel blockers and diuretics. The current treatment provides significant improvement.  Chest Pain  This is a new problem. The current episode started 1 to 4 weeks ago. The onset quality is undetermined. The problem occurs daily. Pertinent negatives include no abdominal pain, cough, dizziness, headaches, palpitations, shortness of breath or weakness.  Her past medical history is significant for hypertension.  Her mother passed away at age 2 from MI with no prior history.  Lab Results  Component Value Date   NA 145 (H) 05/27/2022   K 3.4 (L) 05/27/2022   CO2 27 05/27/2022   GLUCOSE 96 05/27/2022   BUN 17 05/27/2022   CREATININE 0.88 05/27/2022   CALCIUM 9.6 05/27/2022   EGFR 73 05/27/2022   GFRNONAA 77 12/28/2019   Lab Results  Component Value Date   CHOL 180 03/09/2022   HDL 50 03/09/2022   LDLCALC 105 (H) 03/09/2022   TRIG 141 03/09/2022   CHOLHDL 3.6 03/09/2022   Lab Results  Component Value Date   TSH 1.650 03/09/2022   Lab Results  Component Value Date   HGBA1C 5.4 03/09/2022   Lab Results  Component Value Date   WBC 6.7 07/16/2022   HGB 11.2 (L) 07/16/2022   HCT 36.5 07/16/2022   MCV 81.8 07/16/2022   PLT 307 07/16/2022   Lab Results  Component Value Date   ALT 29 05/27/2022   AST 28 05/27/2022   ALKPHOS 140 (H) 05/27/2022   BILITOT 0.4 05/27/2022   No results found for: "25OHVITD2", "25OHVITD3", "VD25OH"   Review of Systems  Constitutional:  Negative for chills, fatigue and unexpected weight change.  HENT:  Negative for nosebleeds.   Eyes:  Negative for visual disturbance.  Respiratory:  Negative for cough, chest  tightness, shortness of breath and wheezing.   Cardiovascular:  Positive for chest pain. Negative for palpitations and leg swelling.  Gastrointestinal:  Negative for abdominal pain, constipation and diarrhea.  Neurological:  Negative for dizziness, weakness, light-headedness and headaches.    Patient Active Problem List   Diagnosis Date Noted   Mild hyperlipidemia 05/27/2022   Other insomnia 05/01/2020   Essential hypertension 12/28/2019   Dysplasia of cervix, low grade (CIN 1) 12/28/2019   Carpal tunnel syndrome 05/30/2014    No Known Allergies  Past Surgical History:  Procedure Laterality Date   arm surgery     BREAST BIOPSY Right 2015   papilloma   BREAST EXCISIONAL BIOPSY Right 2015   neg   CESAREAN SECTION     COLONOSCOPY     GANGLION CYST EXCISION Right    KNEE ARTHROSCOPY WITH MEDIAL MENISECTOMY Left 04/22/2016   Procedure: KNEE ARTHROSCOPY WITH MEDIAL MENISECTOMY;  Surgeon: Kennedy Bucker, MD;  Location: [facility] ORS;  Service: Orthopedics;  Laterality: Left;   LEEP N/A 07/19/2022   Procedure: LOOP ELECTROSURGICAL EXCISION PROCEDURE (LEEP);  Surgeon: Linzie Collin, MD;  Location: [facility] ORS;  Service: Gynecology;  Laterality: N/A;   TONSILLECTOMY     TRIGGER FINGER RELEASE Right     Social History   Tobacco Use   Smoking status: Never    Passive exposure: Never   Smokeless tobacco: Never  Vaping Use   Vaping  Use: Never used  Substance Use Topics   Alcohol use: No   Drug use: No     Medication list has been reviewed and updated.  Current Meds  Medication Sig   amLODipine (NORVASC) 10 MG tablet TAKE 1 TABLET BY MOUTH EVERY DAY   aspirin EC 81 MG tablet Take 81 mg by mouth daily. Swallow whole.   cetirizine (ZYRTEC) 10 MG tablet Take 10 mg by mouth daily.   Cholecalciferol (VITAMIN D3) 5000 units TABS Take 1 tablet by mouth daily.   Multiple Vitamin (MULTIVITAMIN) tablet Take 1 tablet by mouth daily.   Omega-3 Fatty Acids (FISH OIL PO) Take 1 tablet by mouth  daily.   triamterene-hydrochlorothiazide (MAXZIDE-25) 37.5-25 MG tablet TAKE 1 TABLET BY MOUTH EVERY DAY   zaleplon (SONATA) 10 MG capsule TAKE 1 CAPSULE (10 MG TOTAL) BY MOUTH AT BEDTIME AS NEEDED FOR SLEEP.       06/03/2023    9:49 AM 11/29/2022    8:17 AM 05/27/2022    8:28 AM 10/26/2021    8:24 AM  GAD 7 : Generalized Anxiety Score  Nervous, Anxious, on Edge 0 0 0 0  Control/stop worrying 0 0 0 0  Worry too much - different things 0 0 0 0  Trouble relaxing 0 0 0 0  Restless 0 0 0 0  Easily annoyed or irritable 0 0 0 0  Afraid - awful might happen 0 0 0 0  Total GAD 7 Score 0 0 0 0  Anxiety Difficulty Not difficult at all Not difficult at all Not difficult at all Not difficult at all       06/03/2023    9:49 AM 03/23/2023    1:46 PM 11/29/2022    8:17 AM  Depression screen PHQ 2/9  Decreased Interest 0 0 0  Down, Depressed, Hopeless 0 0 0  PHQ - 2 Score 0 0 0  Altered sleeping 0  0  Tired, decreased energy 0  0  Change in appetite 0  0  Feeling bad or failure about yourself  0  0  Trouble concentrating 0  0  Moving slowly or fidgety/restless 0  0  Suicidal thoughts 0  0  PHQ-9 Score 0  0  Difficult doing work/chores Not difficult at all  Not difficult at all    BP Readings from Last 3 Encounters:  06/03/23 128/64  11/29/22 128/66  08/18/22 128/74    Physical Exam Vitals and nursing note reviewed.  Constitutional:      General: She is not in acute distress.    Appearance: She is well-developed.  HENT:     Head: Normocephalic and atraumatic.  Neck:     Vascular: No carotid bruit.  Cardiovascular:     Rate and Rhythm: Regular rhythm. Bradycardia present.     Heart sounds: No murmur heard. Pulmonary:     Effort: Pulmonary effort is normal. No respiratory distress.     Breath sounds: No wheezing or rhonchi.  Musculoskeletal:     Cervical back: Normal range of motion.     Right lower leg: No edema.     Left lower leg: No edema.  Lymphadenopathy:      Cervical: No cervical adenopathy.  Skin:    General: Skin is warm and dry.     Findings: No rash.  Neurological:     General: No focal deficit present.     Mental Status: She is alert and oriented to person, place, and time.  Psychiatric:  Mood and Affect: Mood normal.        Behavior: Behavior normal.     Wt Readings from Last 3 Encounters:  06/03/23 175 lb (79.4 kg)  11/29/22 184 lb (83.5 kg)  08/18/22 183 lb 9.6 oz (83.3 kg)    BP 128/64   Pulse 60   Ht 5' (1.524 m)   Wt 175 lb (79.4 kg)   SpO2 98%   BMI 34.18 kg/m   Assessment and Plan:  Problem List Items Addressed This Visit     Essential hypertension - Primary (Chronic)    Stable exam with well controlled BP.  Currently taking amlodipine and Maxzide. Tolerating medications without side effects.  New chest pain is concerning. Will continue to recommend low sodium diet and current regimen.       Relevant Medications   aspirin EC 81 MG tablet   Other Relevant Orders   CBC with Differential/Platelet   Comprehensive metabolic panel   TSH   Other Visit Diagnoses     Chest pain, unspecified type       EKG - SB otherwise WNL.  Fam hx early CAD continue ASA daily refer to Cardiology   Relevant Orders   EKG 12-Lead (Completed)   Lipid panel   Ambulatory referral to Cardiology       No follow-ups on file.   Partially dictated using Dragon software, any errors are not intentional.  Reubin Milan, MD [facility] [place], [place]

## 2023-06-03 NOTE — Assessment & Plan Note (Addendum)
Stable exam with well controlled BP.  Currently taking amlodipine and Maxzide. Tolerating medications without side effects.  New chest pain is concerning. Will continue to recommend low sodium diet and current regimen.

## 2023-06-04 LAB — COMPREHENSIVE METABOLIC PANEL
ALT: 16 IU/L (ref 0–32)
AST: 20 IU/L (ref 0–40)
Albumin: 4.3 g/dL (ref 3.9–4.9)
Alkaline Phosphatase: 107 IU/L (ref 44–121)
BUN/Creatinine Ratio: 19 (ref 12–28)
BUN: 20 mg/dL (ref 8–27)
Bilirubin Total: 0.3 mg/dL (ref 0.0–1.2)
CO2: 25 mmol/L (ref 20–29)
Calcium: 9.6 mg/dL (ref 8.7–10.3)
Chloride: 104 mmol/L (ref 96–106)
Creatinine, Ser: 1.08 mg/dL — ABNORMAL HIGH (ref 0.57–1.00)
Globulin, Total: 2.5 g/dL (ref 1.5–4.5)
Glucose: 86 mg/dL (ref 70–99)
Potassium: 4.1 mmol/L (ref 3.5–5.2)
Sodium: 145 mmol/L — ABNORMAL HIGH (ref 134–144)
Total Protein: 6.8 g/dL (ref 6.0–8.5)
eGFR: 57 mL/min/{1.73_m2} — ABNORMAL LOW (ref >59)

## 2023-06-04 LAB — CBC WITH DIFFERENTIAL/PLATELET
Basophils Absolute: 0.1 10*3/uL (ref 0.0–0.2)
Basos: 1 %
EOS (ABSOLUTE): 0.1 10*3/uL (ref 0.0–0.4)
Eos: 2 %
Hematocrit: 35.7 % (ref 34.0–46.6)
Hemoglobin: 11.2 g/dL (ref 11.1–15.9)
Immature Grans (Abs): 0 10*3/uL (ref 0.0–0.1)
Immature Granulocytes: 0 %
Lymphocytes Absolute: 1.8 10*3/uL (ref 0.7–3.1)
Lymphs: 35 %
MCH: 25.7 pg — ABNORMAL LOW (ref 26.6–33.0)
MCHC: 31.4 g/dL — ABNORMAL LOW (ref 31.5–35.7)
MCV: 82 fL (ref 79–97)
Monocytes Absolute: 0.4 10*3/uL (ref 0.1–0.9)
Monocytes: 7 %
Neutrophils Absolute: 2.9 10*3/uL (ref 1.4–7.0)
Neutrophils: 55 %
Platelets: 308 10*3/uL (ref 150–450)
RBC: 4.35 x10E6/uL (ref 3.77–5.28)
RDW: 13.8 % (ref 11.7–15.4)
WBC: 5.2 10*3/uL (ref 3.4–10.8)

## 2023-06-04 LAB — LIPID PANEL
Chol/HDL Ratio: 3.3 ratio (ref 0.0–4.4)
Cholesterol, Total: 179 mg/dL (ref 100–199)
HDL: 54 mg/dL (ref >39)
LDL Chol Calc (NIH): 110 mg/dL — ABNORMAL HIGH (ref 0–99)
Triglycerides: 81 mg/dL (ref 0–149)
VLDL Cholesterol Cal: 15 mg/dL (ref 5–40)

## 2023-06-04 LAB — TSH: TSH: 1.39 u[IU]/mL (ref 0.450–4.500)

## 2023-06-09 ENCOUNTER — Ambulatory Visit: Payer: PPO | Admitting: Obstetrics and Gynecology

## 2023-06-10 ENCOUNTER — Encounter: Payer: Self-pay | Admitting: Internal Medicine

## 2023-06-10 NOTE — Telephone Encounter (Signed)
Please review.  KP

## 2023-06-17 ENCOUNTER — Other Ambulatory Visit: Payer: Self-pay | Admitting: Internal Medicine

## 2023-06-17 DIAGNOSIS — I10 Essential (primary) hypertension: Secondary | ICD-10-CM

## 2023-06-21 ENCOUNTER — Ambulatory Visit
Admission: RE | Admit: 2023-06-21 | Discharge: 2023-06-21 | Disposition: A | Discharge status: Home / Self Care | Payer: PPO | Source: Ambulatory Visit | Attending: Obstetrics and Gynecology | Admitting: Obstetrics and Gynecology

## 2023-06-21 DIAGNOSIS — Z1231 Encounter for screening mammogram for malignant neoplasm of breast: Secondary | ICD-10-CM | POA: Diagnosis not present

## 2023-07-02 ENCOUNTER — Other Ambulatory Visit: Payer: Self-pay | Admitting: Internal Medicine

## 2023-07-02 DIAGNOSIS — G4709 Other insomnia: Secondary | ICD-10-CM

## 2023-07-12 ENCOUNTER — Ambulatory Visit (INDEPENDENT_AMBULATORY_CARE_PROVIDER_SITE_OTHER): Payer: PPO | Admitting: Obstetrics and Gynecology

## 2023-07-12 ENCOUNTER — Other Ambulatory Visit (HOSPITAL_COMMUNITY)
Admission: RE | Admit: 2023-07-12 | Discharge: 2023-07-12 | Disposition: A | Discharge status: Home / Self Care | Payer: PPO | Source: Ambulatory Visit | Attending: Obstetrics and Gynecology | Admitting: Obstetrics and Gynecology

## 2023-07-12 ENCOUNTER — Encounter: Payer: Self-pay | Admitting: Obstetrics and Gynecology

## 2023-07-12 VITALS — BP 132/75 | HR 54 | Ht 60.0 in | Wt 175.0 lb

## 2023-07-12 DIAGNOSIS — Z01411 Encounter for gynecological examination (general) (routine) with abnormal findings: Secondary | ICD-10-CM | POA: Insufficient documentation

## 2023-07-12 DIAGNOSIS — Z124 Encounter for screening for malignant neoplasm of cervix: Secondary | ICD-10-CM

## 2023-07-12 DIAGNOSIS — N870 Mild cervical dysplasia: Secondary | ICD-10-CM | POA: Diagnosis not present

## 2023-07-12 DIAGNOSIS — Z01419 Encounter for gynecological examination (general) (routine) without abnormal findings: Secondary | ICD-10-CM | POA: Diagnosis not present

## 2023-07-12 DIAGNOSIS — Z1151 Encounter for screening for human papillomavirus (HPV): Secondary | ICD-10-CM | POA: Insufficient documentation

## 2023-07-12 NOTE — Progress Notes (Signed)
HPI:      [name] is a 67 y.o. [id number] who LMP was No LMP recorded. Patient is postmenopausal.  Subjective:   She presents today for her annual examination.  She has no complaints.  She is up-to-date on her mammography and lab testing.  Of significant note she had CIN-1 and underwent LEEP last year. She reports no postmenopausal bleeding. She is losing weight intentionally with diet and exercise!!!!!!!!!!!!!!!    Hx: The following portions of the patient's history were reviewed and updated as appropriate:             She  has a past medical history of Anemia, Diverticulosis, and Hypertension. She does not have any pertinent problems on file. She  has a past surgical history that includes arm surgery; Cesarean section; Ganglion cyst excision (Right); Trigger finger release (Right); Colonoscopy; Tonsillectomy; Knee arthroscopy with medial menisectomy (Left, 04/22/2016); Breast biopsy (Right, 2015); Breast excisional biopsy (Right, 2015); and LEEP (N/A, 07/19/2022). Her family history includes Breast cancer in her mother; Diabetes in her father; Heart attack (age of onset: 63) in her mother; Kidney disease in her father. She  reports that she has never smoked. She has never been exposed to tobacco smoke. She has never used smokeless tobacco. She reports that she does not drink alcohol and does not use drugs. She has a current medication list which includes the following prescription(s): amlodipine, aspirin ec, cetirizine, vitamin d3, multivitamin, omega-3 fatty acids, triamterene-hydrochlorothiazide, zaleplon, and [DISCONTINUED] hydrochlorothiazide. She has No Known Allergies.       Review of Systems:  Review of Systems  Constitutional: Denied constitutional symptoms, night sweats, recent illness, fatigue, fever, insomnia and weight loss.  Eyes: Denied eye symptoms, eye pain, photophobia, vision change and visual disturbance.  Ears/Nose/Throat/Neck: Denied ear, nose, throat or neck  symptoms, hearing loss, nasal discharge, sinus congestion and sore throat.  Cardiovascular: Denied cardiovascular symptoms, arrhythmia, chest pain/pressure, edema, exercise intolerance, orthopnea and palpitations.  Respiratory: Denied pulmonary symptoms, asthma, pleuritic pain, productive sputum, cough, dyspnea and wheezing.  Gastrointestinal: Denied, gastro-esophageal reflux, melena, nausea and vomiting.  Genitourinary: Denied genitourinary symptoms including symptomatic vaginal discharge, pelvic relaxation issues, and urinary complaints.  Musculoskeletal: Denied musculoskeletal symptoms, stiffness, swelling, muscle weakness and myalgia.  Dermatologic: Denied dermatology symptoms, rash and scar.  Neurologic: Denied neurology symptoms, dizziness, headache, neck pain and syncope.  Psychiatric: Denied psychiatric symptoms, anxiety and depression.  Endocrine: Denied endocrine symptoms including hot flashes and night sweats.   Meds:   Current Outpatient Medications on File Prior to Visit  Medication Sig Dispense Refill   amLODipine (NORVASC) 10 MG tablet TAKE 1 TABLET BY MOUTH EVERY DAY 90 tablet 1   aspirin EC 81 MG tablet Take 81 mg by mouth daily. Swallow whole.     cetirizine (ZYRTEC) 10 MG tablet Take 10 mg by mouth daily.     Cholecalciferol (VITAMIN D3) 5000 units TABS Take 1 tablet by mouth daily.     Multiple Vitamin (MULTIVITAMIN) tablet Take 1 tablet by mouth daily.     Omega-3 Fatty Acids (FISH OIL PO) Take 1 tablet by mouth daily.     triamterene-hydrochlorothiazide (MAXZIDE-25) 37.5-25 MG tablet TAKE 1 TABLET BY MOUTH EVERY DAY 90 tablet 1   zaleplon (SONATA) 10 MG capsule TAKE 1 CAPSULE (10 MG TOTAL) BY MOUTH AT BEDTIME AS NEEDED FOR SLEEP. 30 capsule 1   [DISCONTINUED] hydrochlorothiazide (HYDRODIURIL) 25 MG tablet Take 25 mg by mouth daily.     No current facility-administered medications on  file prior to visit.     Objective:     Vitals:   07/12/23 0939  BP: 132/75   Pulse: (!) 54    Filed Weights   07/12/23 0939  Weight: 175 lb (79.4 kg)              Physical examination    Pelvic:   Vulva: Normal appearance.  No lesions.  Vagina: No lesions or abnormalities noted.  Support: Normal pelvic support.  Urethra No masses tenderness or scarring.  Meatus Normal size without lesions or prolapse.  Cervix: Normal appearance.  No lesions.  Obviously status post procedure but looks very good.  Anus: Normal exam.  No lesions.  Perineum: Normal exam.  No lesions.        Bimanual   Uterus: Normal size.  Non-tender.  Mobile.  AV.  Adnexae: No masses.  Non-tender to palpation.  Cul-de-sac: Negative for abnormality.     Assessment:    [id number] Patient Active Problem List   Diagnosis Date Noted   Mild hyperlipidemia 05/27/2022   Other insomnia 05/01/2020   Essential hypertension 12/28/2019   Dysplasia of cervix, low grade (CIN 1) 12/28/2019   Carpal tunnel syndrome 05/30/2014     1. Well woman exam with routine gynecological exam   2. Cervical cancer screening   3. CIN I (cervical intraepithelial neoplasia I)        Plan:            1.  Basic Screening Recommendations The basic screening recommendations for asymptomatic women were discussed with the patient during her visit.  The age-appropriate recommendations were discussed with her and the rational for the tests reviewed.  When I am informed by the patient that another primary care physician has previously obtained the age-appropriate tests and they are up-to-date, only outstanding tests are ordered and referrals given as necessary.  Abnormal results of tests will be discussed with her when all of her results are completed.  Routine preventative health maintenance measures emphasized: Exercise/Diet/Weight control, Tobacco Warnings, Alcohol/Substance use risks and Stress Management 2.  Pap performed-patient less than 1 year from LEEP with pathology of CIN-1. 3.  Celebrate intentional weight  loss. Orders No orders of the defined types were placed in this encounter.   No orders of the defined types were placed in this encounter.         F/U  No follow-ups on file.  Elonda Husky, M.D. 07/12/2023 10:19 AM

## 2023-07-12 NOTE — Progress Notes (Signed)
Patients presents for annual exam today. She states doing well over the past few months. Due for pap smear, ordered. Mammogram and annual labs are recently completed. She states no other questions or concerns at this time.

## 2023-08-09 ENCOUNTER — Encounter: Payer: Self-pay | Admitting: Cardiology

## 2023-08-09 ENCOUNTER — Ambulatory Visit: Payer: PPO | Attending: Cardiology | Admitting: Cardiology

## 2023-08-09 ENCOUNTER — Ambulatory Visit: Payer: PPO | Admitting: Cardiology

## 2023-08-09 VITALS — BP 132/60 | HR 64 | Ht 60.0 in | Wt 174.8 lb

## 2023-08-09 DIAGNOSIS — R072 Precordial pain: Secondary | ICD-10-CM

## 2023-08-09 DIAGNOSIS — I10 Essential (primary) hypertension: Secondary | ICD-10-CM

## 2023-08-09 MED ORDER — METOPROLOL TARTRATE 100 MG PO TABS: 100.0000 mg | ORAL_TABLET | Freq: Once | ORAL | 0 refills | Status: DC

## 2023-08-09 NOTE — Progress Notes (Signed)
Cardiology Office Note:    Date:  08/09/2023   ID:  Chelsea [name], DOB 09/08/1956, MRN [id number]  PCP:  Reubin Milan, MD    [facility]Care Providers Cardiologist:  Debbe Odea, MD     Referring MD: Reubin Milan, MD   Chief Complaint  Patient presents with   New Patient (Initial Visit)    Referred for cardiac evaluation of intermittent mid chest pain with no cardiac history.     Chelsea [name] is a 67 y.o. female who is being seen today for the evaluation of chest pain at the request of Reubin Milan, MD.   History of Present Illness:    Chelsea [name] is a 67 y.o. female with a hx of hypertension who presents due to chest pain.  Complaints of chest pain ongoing over the past 2 months.  Symptoms are not associated with exertion, typically lasts about 2 to 3 seconds.  Mother had a heart attack age 27.  She denies smoking, edema, palpitations.  Otherwise doing okay.  Denies any history of heartburn.  Past Medical History:  Diagnosis Date   Anemia    as a child   Diverticulosis    Hypertension     Past Surgical History:  Procedure Laterality Date   arm surgery     BREAST BIOPSY Right 2015   papilloma   BREAST EXCISIONAL BIOPSY Right 2015   neg   CESAREAN SECTION     COLONOSCOPY     GANGLION CYST EXCISION Right    KNEE ARTHROSCOPY WITH MEDIAL MENISECTOMY Left 04/22/2016   Procedure: KNEE ARTHROSCOPY WITH MEDIAL MENISECTOMY;  Surgeon: Kennedy Bucker, MD;  Location: [facility] ORS;  Service: Orthopedics;  Laterality: Left;   LEEP N/A 07/19/2022   Procedure: LOOP ELECTROSURGICAL EXCISION PROCEDURE (LEEP);  Surgeon: Linzie Collin, MD;  Location: [facility] ORS;  Service: Gynecology;  Laterality: N/A;   TONSILLECTOMY     TRIGGER FINGER RELEASE Right     Current Medications: Current Meds  Medication Sig   amLODipine (NORVASC) 10 MG tablet TAKE 1 TABLET BY MOUTH EVERY DAY   aspirin EC 81 MG tablet Take 81 mg by mouth daily. Swallow whole.   cetirizine  (ZYRTEC) 10 MG tablet Take 10 mg by mouth daily.   Cholecalciferol (VITAMIN D3) 5000 units TABS Take 1 tablet by mouth daily.   metoprolol tartrate (LOPRESSOR) 100 MG tablet Take 1 tablet (100 mg total) by mouth once for 1 dose. TWO HOURS PRIOR TO CARDIAC CTA   Multiple Vitamin (MULTIVITAMIN) tablet Take 1 tablet by mouth daily.   Omega-3 Fatty Acids (FISH OIL PO) Take 1 tablet by mouth daily.   triamterene-hydrochlorothiazide (MAXZIDE-25) 37.5-25 MG tablet TAKE 1 TABLET BY MOUTH EVERY DAY   zaleplon (SONATA) 10 MG capsule TAKE 1 CAPSULE (10 MG TOTAL) BY MOUTH AT BEDTIME AS NEEDED FOR SLEEP.     Allergies:   Patient has no known allergies.   Social History   Socioeconomic History   Marital status: Married    Spouse name: Earlene Plater   Number of children: 2   Years of education: Not on file   Highest education level: Associate degree: academic program  Occupational History   Not on file  Tobacco Use   Smoking status: Never    Passive exposure: Never   Smokeless tobacco: Never  Vaping Use   Vaping status: Never Used  Substance and Sexual Activity   Alcohol use: No   Drug use: No   Sexual activity: Yes  Birth control/protection: Post-menopausal  Other Topics Concern   Not on file  Social History Narrative   Not on file   Social Determinants of Health   Financial Resource Strain: Low Risk  (06/03/2023)   Overall Financial Resource Strain (CARDIA)    Difficulty of Paying Living Expenses: Not very hard  Food Insecurity: Food Insecurity Present (06/03/2023)   Hunger Vital Sign    Worried About Running Out of Food in the Last Year: Sometimes true    Ran Out of Food in the Last Year: Sometimes true  Transportation Needs: No Transportation Needs (06/03/2023)   PRAPARE - Administrator, Civil Service (Medical): No    Lack of Transportation (Non-Medical): No  Physical Activity: Sufficiently Active (06/03/2023)   Exercise Vital Sign    Days of Exercise per Week: 3 days     Minutes of Exercise per Session: 60 min  Stress: No Stress Concern Present (06/03/2023)   [employer] of Occupational Health - Occupational Stress Questionnaire    Feeling of Stress : Not at all  Social Connections: Socially Integrated (06/03/2023)   Social Connection and Isolation Panel [NHANES]    Frequency of Communication with Friends and Family: More than three times a week    Frequency of Social Gatherings with Friends and Family: More than three times a week    Attends Religious Services: More than 4 times per year    Active Member of [employer] or Organizations: Yes    Attends Engineer, structural: More than 4 times per year    Marital Status: Married     Family History: The patient's family history includes Breast cancer in her mother; Diabetes in her father; Heart attack (age of onset: 30) in her mother; Kidney disease in her father.  ROS:   Please see the history of present illness.     All other systems reviewed and are negative.  EKGs/Labs/Other Studies Reviewed:    The following studies were reviewed today:  EKG Interpretation Date/Time:  Tuesday August 09 2023 11:42:00 EDT Ventricular Rate:  64 PR Interval:  176 QRS Duration:  98 QT Interval:  414 QTC Calculation: 427 R Axis:   -40  Text Interpretation: Normal sinus rhythm Left axis deviation Moderate voltage criteria for LVH, may be normal variant ( R in aVL , Cornell product ) Nonspecific T wave abnormality Confirmed by Debbe Odea ([id number]) on 08/09/2023 11:53:12 AM    Recent Labs: 06/03/2023: ALT 16; BUN 20; Creatinine, Ser 1.08; Hemoglobin 11.2; Platelets 308; Potassium 4.1; Sodium 145; TSH 1.390  Recent Lipid Panel    Component Value Date/Time   CHOL 179 06/03/2023 1110   TRIG 81 06/03/2023 1110   HDL 54 06/03/2023 1110   CHOLHDL 3.3 06/03/2023 1110   LDLCALC 110 (H) 06/03/2023 1110     Risk Assessment/Calculations:             Physical Exam:    VS:  BP 132/60 (BP Location: Right  Arm, Patient Position: Sitting, Cuff Size: Large)   Pulse 64   Ht 5' (1.524 m)   Wt 174 lb 12.8 oz (79.3 kg)   SpO2 99%   BMI 34.14 kg/m     Wt Readings from Last 3 Encounters:  08/09/23 174 lb 12.8 oz (79.3 kg)  07/12/23 175 lb (79.4 kg)  06/03/23 175 lb (79.4 kg)     GEN:  Well nourished, well developed in no acute distress HEENT: Normal NECK: No JVD; No carotid bruits CARDIAC: RRR, no murmurs,  rubs, gallops RESPIRATORY:  Clear to auscultation without rales, wheezing or rhonchi  ABDOMEN: Soft, non-tender, non-distended MUSCULOSKELETAL:  No edema; No deformity  SKIN: Warm and dry NEUROLOGIC:  Alert and oriented x 3 PSYCHIATRIC:  Normal affect   ASSESSMENT:    1. Precordial pain   2. Primary hypertension    PLAN:    In order of problems listed above:  Chest pain, risk factors hypertension.  Get echo, get coronary CT to evaluate obstructive CAD. Hypertension, BP controlled.  Continue Norvasc, HCTZ.  Follow-up after cardiac testing     Medication Adjustments/Labs and Tests Ordered: Current medicines are reviewed at length with the patient today.  Concerns regarding medicines are outlined above.  Orders Placed This Encounter  Procedures   CT CORONARY MORPH W/CTA COR W/SCORE W/CA W/CM &/OR WO/CM   Basic Metabolic Panel (BMET)   EKG 12-Lead   ECHOCARDIOGRAM COMPLETE   Meds ordered this encounter  Medications   metoprolol tartrate (LOPRESSOR) 100 MG tablet    Sig: Take 1 tablet (100 mg total) by mouth once for 1 dose. TWO HOURS PRIOR TO CARDIAC CTA    Dispense:  1 tablet    Refill:  0    Patient Instructions  Medication Instructions:   Your physician recommends that you continue on your current medications as directed. Please refer to the Current Medication list given to you today.  *If you need a refill on your cardiac medications before your next appointment, please call your pharmacy*   Lab Work:  Your physician recommends you have lab work - BMP   If  you have labs (blood work) drawn today and your tests are completely normal, you will receive your results only by: MyChart Message (if you have MyChart) OR A paper copy in the mail If you have any lab test that is abnormal or we need to change your treatment, we will call you to review the results.   Testing/Procedures:  Your physician has requested that you have an echocardiogram. Echocardiography is a painless test that uses sound waves to create images of your heart. It provides your doctor with information about the size and shape of your heart and how well your heart's chambers and valves are working. This procedure takes approximately one hour. There are no restrictions for this procedure. Please do NOT wear cologne, perfume, aftershave, or lotions (deodorant is allowed). Please arrive 15 minutes prior to your appointment time.    Your cardiac CT will be scheduled at one of the below locations:   [facility] [address] [place], [place] [address] [phone]  OR   [facility] Heart and Vascular entrance [address] [place], [place] [address] [phone]  Please follow these instructions carefully (unless otherwise directed):  An IV will be required for this test and Nitroglycerin will be given.  Hold all erectile dysfunction medications at least 3 days (72 hrs) prior to test. (Ie viagra, cialis, sildenafil, tadalafil, etc)   On the Night Before the Test: Be sure to Drink plenty of water. Do not consume any caffeinated/decaffeinated beverages or chocolate 12 hours prior to your test. Do not take any antihistamines 12 hours prior to your test.  On the Day of the Test: Drink plenty of water until 1 hour prior to the test. Do not eat any food 1 hour prior to test. You may take your regular medications prior to the test.  Take metoprolol (Lopressor) two hours prior to test. FEMALES- please wear  underwire-free bra if  available, avoid dresses & tight clothing      After the Test: Drink plenty of water. After receiving IV contrast, you may experience a mild flushed feeling. This is normal. On occasion, you may experience a mild rash up to 24 hours after the test. This is not dangerous. If this occurs, you can take Benadryl 25 mg and increase your fluid intake. If you experience trouble breathing, this can be serious. If it is severe call 911 IMMEDIATELY. If it is mild, please call our office. If you take any of these medications: Glipizide/Metformin, Avandament, Glucavance, please do not take 48 hours after completing test unless otherwise instructed.  We will call to schedule your test 2-4 weeks out understanding that some insurance companies will need an authorization prior to the service being performed.   For more information and frequently asked questions, please visit our website : [email or web address]  For non-scheduling related questions, please contact the cardiac imaging nurse navigator should you have any questions/concerns: Cardiac Imaging Nurse Navigators Direct Office Dial: [phone]   For scheduling needs, including cancellations and rescheduling, please call [place], [phone].   Follow-Up: At [facility], you and your health needs are our priority.  As part of our continuing mission to provide you with exceptional heart care, we have created designated Provider Care Teams.  These Care Teams include your primary Cardiologist (physician) and Advanced Practice Providers (APPs -  Physician Assistants and Nurse Practitioners) who all work together to provide you with the care you need, when you need it.  We recommend signing up for the patient portal called "MyChart".  Sign up information is provided on this After Visit Summary.  MyChart is used to connect with patients for Virtual Visits (Telemedicine).  Patients are able to view lab/test results, encounter notes,  upcoming appointments, etc.  Non-urgent messages can be sent to your provider as well.   To learn more about what you can do with MyChart, go to [email or web address].    Your next appointment:    After Cardiac Testing  Provider:   You may see Debbe Odea, MD or one of the following Advanced Practice Providers on your designated Care Team:   Nicolasa Ducking, NP Eula Listen, PA-C Cadence Fransico Michael, PA-C Charlsie Quest, NP   Signed, Debbe Odea, MD  08/09/2023 12:35 PM    [facility]

## 2023-08-09 NOTE — Patient Instructions (Signed)
Medication Instructions:   Your physician recommends that you continue on your current medications as directed. Please refer to the Current Medication list given to you today.  *If you need a refill on your cardiac medications before your next appointment, please call your pharmacy*   Lab Work:  Your physician recommends you have lab work - BMP   If you have labs (blood work) drawn today and your tests are completely normal, you will receive your results only by: MyChart Message (if you have MyChart) OR A paper copy in the mail If you have any lab test that is abnormal or we need to change your treatment, we will call you to review the results.   Testing/Procedures:  Your physician has requested that you have an echocardiogram. Echocardiography is a painless test that uses sound waves to create images of your heart. It provides your doctor with information about the size and shape of your heart and how well your heart's chambers and valves are working. This procedure takes approximately one hour. There are no restrictions for this procedure. Please do NOT wear cologne, perfume, aftershave, or lotions (deodorant is allowed). Please arrive 15 minutes prior to your appointment time.    Your cardiac CT will be scheduled at one of the below locations:   [facility] [address] [place], [place] [address] [phone]  OR   [facility] Heart and Vascular entrance [address] [place], [place] [address] [phone]  Please follow these instructions carefully (unless otherwise directed):  An IV will be required for this test and Nitroglycerin will be given.  Hold all erectile dysfunction medications at least 3 days (72 hrs) prior to test. (Ie viagra, cialis, sildenafil, tadalafil, etc)   On the Night Before the Test: Be sure to Drink plenty of water. Do not consume any caffeinated/decaffeinated beverages or chocolate 12 hours prior  to your test. Do not take any antihistamines 12 hours prior to your test.  On the Day of the Test: Drink plenty of water until 1 hour prior to the test. Do not eat any food 1 hour prior to test. You may take your regular medications prior to the test.  Take metoprolol (Lopressor) two hours prior to test. FEMALES- please wear underwire-free bra if available, avoid dresses & tight clothing      After the Test: Drink plenty of water. After receiving IV contrast, you may experience a mild flushed feeling. This is normal. On occasion, you may experience a mild rash up to 24 hours after the test. This is not dangerous. If this occurs, you can take Benadryl 25 mg and increase your fluid intake. If you experience trouble breathing, this can be serious. If it is severe call 911 IMMEDIATELY. If it is mild, please call our office. If you take any of these medications: Glipizide/Metformin, Avandament, Glucavance, please do not take 48 hours after completing test unless otherwise instructed.  We will call to schedule your test 2-4 weeks out understanding that some insurance companies will need an authorization prior to the service being performed.   For more information and frequently asked questions, please visit our website : [email or web address]  For non-scheduling related questions, please contact the cardiac imaging nurse navigator should you have any questions/concerns: Cardiac Imaging Nurse Navigators Direct Office Dial: [phone]   For scheduling needs, including cancellations and rescheduling, please call [place], [phone].   Follow-Up: At [facility], you and your health needs are our priority.  As  part of our continuing mission to provide you with exceptional heart care, we have created designated Provider Care Teams.  These Care Teams include your primary Cardiologist (physician) and Advanced Practice Providers (APPs -  Physician Assistants and Nurse  Practitioners) who all work together to provide you with the care you need, when you need it.  We recommend signing up for the patient portal called "MyChart".  Sign up information is provided on this After Visit Summary.  MyChart is used to connect with patients for Virtual Visits (Telemedicine).  Patients are able to view lab/test results, encounter notes, upcoming appointments, etc.  Non-urgent messages can be sent to your provider as well.   To learn more about what you can do with MyChart, go to [email or web address].    Your next appointment:    After Cardiac Testing  Provider:   You may see Debbe Odea, MD or one of the following Advanced Practice Providers on your designated Care Team:   Nicolasa Ducking, NP Eula Listen, PA-C Cadence Fransico Michael, PA-C Charlsie Quest, NP

## 2023-08-10 LAB — BASIC METABOLIC PANEL
BUN/Creatinine Ratio: 18 (ref 12–28)
BUN: 18 mg/dL (ref 8–27)
CO2: 26 mmol/L (ref 20–29)
Calcium: 9.8 mg/dL (ref 8.7–10.3)
Chloride: 101 mmol/L (ref 96–106)
Creatinine, Ser: 0.99 mg/dL (ref 0.57–1.00)
Glucose: 86 mg/dL (ref 70–99)
Potassium: 4.1 mmol/L (ref 3.5–5.2)
Sodium: 144 mmol/L (ref 134–144)
eGFR: 63 mL/min/{1.73_m2} (ref >59)

## 2023-08-12 ENCOUNTER — Telehealth (HOSPITAL_COMMUNITY): Payer: Self-pay | Admitting: Emergency Medicine

## 2023-08-12 NOTE — Telephone Encounter (Signed)
Reaching out to patient to offer assistance regarding upcoming cardiac imaging study; pt verbalizes understanding of appt date/time, parking situation and where to check in, pre-test NPO status and medications ordered, and verified current allergies; name and call back number provided for further questions should they arise Sara Wallace RN Navigator Cardiac Imaging [facility] [phone] office [phone] cell 

## 2023-08-17 ENCOUNTER — Ambulatory Visit
Admission: RE | Admit: 2023-08-17 | Discharge: 2023-08-17 | Disposition: A | Discharge status: Home / Self Care | Payer: PPO | Source: Ambulatory Visit | Attending: Cardiology | Admitting: Cardiology

## 2023-08-17 DIAGNOSIS — R072 Precordial pain: Secondary | ICD-10-CM | POA: Insufficient documentation

## 2023-08-17 MED ORDER — NITROGLYCERIN 0.4 MG SL SUBL
0.8000 mg | SUBLINGUAL_TABLET | Freq: Once | SUBLINGUAL | Status: AC
  Administered 2023-08-17: 0.8 mg via SUBLINGUAL
  Filled 2023-08-17: qty 25

## 2023-08-17 MED ORDER — IOHEXOL 350 MG/ML SOLN
80.0000 mL | Freq: Once | INTRAVENOUS | Status: AC | PRN
  Administered 2023-08-17: 80 mL via INTRAVENOUS

## 2023-08-17 NOTE — Progress Notes (Signed)

## 2023-08-18 ENCOUNTER — Other Ambulatory Visit: Payer: Self-pay | Admitting: Cardiology

## 2023-08-18 DIAGNOSIS — R072 Precordial pain: Secondary | ICD-10-CM

## 2023-08-18 DIAGNOSIS — I10 Essential (primary) hypertension: Secondary | ICD-10-CM

## 2023-08-29 ENCOUNTER — Ambulatory Visit: Payer: PPO | Attending: Cardiology

## 2023-08-29 DIAGNOSIS — R072 Precordial pain: Secondary | ICD-10-CM | POA: Diagnosis not present

## 2023-08-29 LAB — ECHOCARDIOGRAM COMPLETE
Area-P 1/2: 3.72 cm2
S' Lateral: 2.2 cm

## 2023-08-31 ENCOUNTER — Other Ambulatory Visit: Payer: PPO

## 2023-09-05 ENCOUNTER — Ambulatory Visit: Payer: PPO | Admitting: Internal Medicine

## 2023-09-05 NOTE — Progress Notes (Deleted)
Cardiology Office Note    Date:  09/05/2023   ID:  [name], Chelsea [birth year], MRN [id number]  PCP:  Reubin Milan, MD  Cardiologist:  Debbe Odea, MD  Electrophysiologist:  None   Chief Complaint: Follow-up  History of Present Illness:   Chelsea [name] is a 67 y.o. female with history of minimal coronary artery calcification noted on coronary CTA in 08/2023 and HTN who presents for follow-up of coronary CTA and echo.  She was evaluated as a new patient by Dr. Azucena Cecil on 08/09/2023 for 60-month history of nonexertional chest pain that would last 2 to 3 seconds in duration.  Coronary CTA on 9//2024 showed a calcium score of 5.08 which was the 63rd percentile.  There was some minimal proximal LAD disease estimated less than 25%.  Echo on 08/29/2023 showed an EF of 60 to 65%, no regional wall motion abnormalities, mild LVH, normal LV diastolic function parameters, normal systolic function and ventricular cavity size, mild mitral regurgitation, and an estimated right atrial pressure of 3 mmHg.  ***   Labs independently reviewed: 07/2023 - BUN 18, serum creatinine 0.99, potassium 4.1 05/2023 - TSH normal, TC 179, TG 81, HDL 54, LDL 110, albumin 4.3, AST/ALT normal, Hgb 11.2, PLT 308  Past Medical History:  Diagnosis Date   Anemia    as a child   Diverticulosis    Hypertension     Past Surgical History:  Procedure Laterality Date   arm surgery     BREAST BIOPSY Right 2015   papilloma   BREAST EXCISIONAL BIOPSY Right 2015   neg   CESAREAN SECTION     COLONOSCOPY     GANGLION CYST EXCISION Right    KNEE ARTHROSCOPY WITH MEDIAL MENISECTOMY Left 04/22/2016   Procedure: KNEE ARTHROSCOPY WITH MEDIAL MENISECTOMY;  Surgeon: Kennedy Bucker, MD;  Location: [facility] ORS;  Service: Orthopedics;  Laterality: Left;   LEEP N/A 07/19/2022   Procedure: LOOP ELECTROSURGICAL EXCISION PROCEDURE (LEEP);  Surgeon: Linzie Collin, MD;  Location: [facility] OR[facility];  Service: Gynecology;  Laterality:  N/A;   TONSILLECTOMY     TRIGGER FINGER RELEASE Right     Current Medications: No outpatient medications have been marked as taking for the 09/08/23 encounter (Appointment) with Sondra Barges, PA-C.    Allergies:   Patient has no known allergies.   Social History   Socioeconomic History   Marital status: Married    Spouse name: Earlene Plater   Number of children: 2   Years of education: Not on file   Highest education level: Associate degree: academic program  Occupational History   Not on file  Tobacco Use   Smoking status: Never    Passive exposure: Never   Smokeless tobacco: Never  Vaping Use   Vaping status: Never Used  Substance and Sexual Activity   Alcohol use: No   Drug use: No   Sexual activity: Yes    Birth control/protection: Post-menopausal  Other Topics Concern   Not on file  Social History Narrative   Not on file   Social Determinants of Health   Financial Resource Strain: Low Risk  (06/03/2023)   Overall Financial Resource Strain (CARDIA)    Difficulty of Paying Living Expenses: Not very hard  Food Insecurity: Food Insecurity Present (06/03/2023)   Hunger Vital Sign    Worried About Running Out of Food in the Last Year: Sometimes true    Ran Out of Food in the Last Year: Sometimes true  Transportation Needs:  No Transportation Needs (06/03/2023)   PRAPARE - Administrator, Civil Service (Medical): No    Lack of Transportation (Non-Medical): No  Physical Activity: Sufficiently Active (06/03/2023)   Exercise Vital Sign    Days of Exercise per Week: 3 days    Minutes of Exercise per Session: 60 min  Stress: No Stress Concern Present (06/03/2023)   [employer] of Occupational Health - Occupational Stress Questionnaire    Feeling of Stress : Not at all  Social Connections: Socially Integrated (06/03/2023)   Social Connection and Isolation Panel [NHANES]    Frequency of Communication with Friends and Family: More than three times a week     Frequency of Social Gatherings with Friends and Family: More than three times a week    Attends Religious Services: More than 4 times per year    Active Member of [employer] or Organizations: Yes    Attends Engineer, structural: More than 4 times per year    Marital Status: Married     Family History:  The patient's family history includes Breast cancer in her mother; Diabetes in her father; Heart attack (age of onset: 18) in her mother; Kidney disease in her father.  ROS:   12-point review of systems is negative unless otherwise noted in the HPI.   EKGs/Labs/Other Studies Reviewed:    Studies reviewed were summarized above. The additional studies were reviewed today:  2D echo 08/29/2023: 1. Left ventricular ejection fraction, by estimation, is 60 to 65%. The  left ventricle has normal function. The left ventricle has no regional  wall motion abnormalities. There is mild left ventricular hypertrophy.  Left ventricular diastolic parameters  were normal.   2. Right ventricular systolic function is normal. The right ventricular  size is normal.   3. The mitral valve is normal in structure. Mild mitral valve  regurgitation.   4. The aortic valve is tricuspid. Aortic valve regurgitation is not  visualized.   5. The inferior vena cava is normal in size with greater than 50%  respiratory variability, suggesting right atrial pressure of 3 mmHg.  __________  Coronary CTA 08/17/2023: FINDINGS: Aorta:  Normal size.  No calcifications.  No dissection.   Aortic Valve:  Trileaflet.  No calcifications.   Coronary Arteries:  Normal coronary origin.  Right dominance.   RCA is a dominant artery. There is no plaque.   Left main gives rise to LAD and LCX arteries. LM has no disease.   LAD has calcified plaque in the proximal LAD causing minimal stenosis (<25%).   LCX is a non-dominant artery.  There is no plaque.   Other findings:   Normal pulmonary vein drainage into the left  atrium.   Normal left atrial appendage without a thrombus.   Normal size of the pulmonary artery.   IMPRESSION: 1. Coronary calcium score of 5.08. This was 63rd percentile for age and sex matched control. 2. Normal coronary origin with right dominance. 3. Minimal proximal LAD disease (<25%). 4. CAD-RADS 1. Minimal non-obstructive CAD (0-24%). Consider preventive therapy and risk factor modification.   EKG:  EKG is ordered today.  The EKG ordered today demonstrates ***  Recent Labs: 06/03/2023: ALT 16; Hemoglobin 11.2; Platelets 308; TSH 1.390 08/09/2023: BUN 18; Creatinine, Ser 0.99; Potassium 4.1; Sodium 144  Recent Lipid Panel    Component Value Date/Time   CHOL 179 06/03/2023 1110   TRIG 81 06/03/2023 1110   HDL 54 06/03/2023 1110   CHOLHDL 3.3 06/03/2023 1110  LDLCALC 110 (H) 06/03/2023 1110    PHYSICAL EXAM:    VS:  There were no vitals taken for this visit.  BMI: There is no height or weight on file to calculate BMI.  Physical Exam  Wt Readings from Last 3 Encounters:  08/09/23 174 lb 12.8 oz (79.3 kg)  07/12/23 175 lb (79.4 kg)  06/03/23 175 lb (79.4 kg)     ASSESSMENT & PLAN:   Minimal nonobstructive CAD:  HTN: Blood pressure   {Are you ordering a CV Procedure (e.g. stress test, cath, DCCV, TEE, etc)?   Press F2        :[id number]}     Disposition: F/u with Dr. Azucena Cecil or an APP in ***.   Medication Adjustments/Labs and Tests Ordered: Current medicines are reviewed at length with the patient today.  Concerns regarding medicines are outlined above. Medication changes, Labs and Tests ordered today are summarized above and listed in the Patient Instructions accessible in Encounters.   Signed, Eula Listen, PA-C 09/05/2023 2:33 PM     [facility] [place] [address] [place], [place] [address] [phone]

## 2023-09-08 ENCOUNTER — Ambulatory Visit: Payer: PPO | Admitting: Physician Assistant

## 2023-09-09 ENCOUNTER — Encounter: Payer: Self-pay | Admitting: Internal Medicine

## 2023-09-09 ENCOUNTER — Ambulatory Visit (INDEPENDENT_AMBULATORY_CARE_PROVIDER_SITE_OTHER): Payer: PPO | Admitting: Internal Medicine

## 2023-09-09 VITALS — BP 122/72 | HR 81 | Ht 60.0 in | Wt 178.0 lb

## 2023-09-09 DIAGNOSIS — I10 Essential (primary) hypertension: Secondary | ICD-10-CM | POA: Diagnosis not present

## 2023-09-09 DIAGNOSIS — I2510 Atherosclerotic heart disease of native coronary artery without angina pectoris: Secondary | ICD-10-CM | POA: Diagnosis not present

## 2023-09-09 DIAGNOSIS — Z23 Encounter for immunization: Secondary | ICD-10-CM | POA: Diagnosis not present

## 2023-09-09 NOTE — Assessment & Plan Note (Signed)
Coronary calcium CT: 1. Coronary calcium score of 5.08. This was 63rd percentile for age and sex matched control.  2. Normal coronary origin with right dominance.  3. Minimal proximal LAD disease (<25%).  4. CAD-RADS 1. Minimal non-obstructive CAD (0-24%). Consider preventive therapy and risk factor modification. ECHO - mild bystolic and systolic dysfunction; EF 65%

## 2023-09-09 NOTE — Progress Notes (Signed)
Date:  09/09/2023   Name:  Chelsea [name]   DOB:  24-Jan-1956   MRN:  [id number]   Chief Complaint: Hypertension and Chronic Kidney Disease  Hypertension This is a chronic problem. The problem is controlled. Associated symptoms include chest pain (mid sternal pain). Pertinent negatives include no headaches, palpitations or shortness of breath. Past treatments include calcium channel blockers and diuretics.   Renal function - this was decreased slightly in July.  She has been drinking more water and repeat last month was normal.  Will continue hydration and regular monitoring.  Atypical chest pain - seen by Cardiology.  Coronary calcium score 5.08, 25% prox LAD.  ECHO - normal EF, mild LV hypertrophy.  The pain is much improved.  No new symptoms to suggest etiology.  Lab Results  Component Value Date   NA 144 08/09/2023   K 4.1 08/09/2023   CO2 26 08/09/2023   GLUCOSE 86 08/09/2023   BUN 18 08/09/2023   CREATININE 0.99 08/09/2023   CALCIUM 9.8 08/09/2023   EGFR 63 08/09/2023   GFRNONAA 77 12/28/2019   Lab Results  Component Value Date   CHOL 179 06/03/2023   HDL 54 06/03/2023   LDLCALC 110 (H) 06/03/2023   TRIG 81 06/03/2023   CHOLHDL 3.3 06/03/2023   Lab Results  Component Value Date   TSH 1.390 06/03/2023   Lab Results  Component Value Date   HGBA1C 5.4 03/09/2022   Lab Results  Component Value Date   WBC 5.2 06/03/2023   HGB 11.2 06/03/2023   HCT 35.7 06/03/2023   MCV 82 06/03/2023   PLT 308 06/03/2023   Lab Results  Component Value Date   ALT 16 06/03/2023   AST 20 06/03/2023   ALKPHOS 107 06/03/2023   BILITOT 0.3 06/03/2023   No results found for: "25OHVITD2", "25OHVITD3", "VD25OH"   Review of Systems  Constitutional:  Negative for chills, fatigue and unexpected weight change.  HENT:  Negative for trouble swallowing.   Eyes:  Negative for visual disturbance.  Respiratory:  Negative for cough, chest tightness, shortness of breath and wheezing.    Cardiovascular:  Positive for chest pain (mid sternal pain). Negative for palpitations and leg swelling.  Gastrointestinal:  Negative for abdominal pain, constipation and diarrhea.  Musculoskeletal:  Negative for arthralgias.  Neurological:  Negative for dizziness, weakness, light-headedness and headaches.  Psychiatric/Behavioral:  Positive for sleep disturbance. Negative for dysphoric mood. The patient is not nervous/anxious.     Patient Active Problem List   Diagnosis Date Noted   Coronary artery disease involving native coronary artery of native heart without angina pectoris 09/09/2023   Mild hyperlipidemia 05/27/2022   Other insomnia 05/01/2020   Essential hypertension 12/28/2019   Dysplasia of cervix, low grade (CIN 1) 12/28/2019   Carpal tunnel syndrome 05/30/2014    No Known Allergies  Past Surgical History:  Procedure Laterality Date   arm surgery     BREAST BIOPSY Right 2015   papilloma   BREAST EXCISIONAL BIOPSY Right 2015   neg   CESAREAN SECTION     COLONOSCOPY     GANGLION CYST EXCISION Right    KNEE ARTHROSCOPY WITH MEDIAL MENISECTOMY Left 04/22/2016   Procedure: KNEE ARTHROSCOPY WITH MEDIAL MENISECTOMY;  Surgeon: Kennedy Bucker, MD;  Location: [facility];  Service: Orthopedics;  Laterality: Left;   LEEP N/A 07/19/2022   Procedure: LOOP ELECTROSURGICAL EXCISION PROCEDURE (LEEP);  Surgeon: Linzie Collin, MD;  Location: [facility];  Service: Gynecology;  Laterality: N/A;  TONSILLECTOMY     TRIGGER FINGER RELEASE Right     Social History   Tobacco Use   Smoking status: Never    Passive exposure: Never   Smokeless tobacco: Never  Vaping Use   Vaping status: Never Used  Substance Use Topics   Alcohol use: No   Drug use: No     Medication list has been reviewed and updated.  Current Meds  Medication Sig   amLODipine (NORVASC) 10 MG tablet TAKE 1 TABLET BY MOUTH EVERY DAY   aspirin EC 81 MG tablet Take 81 mg by mouth daily. Swallow whole.   cetirizine  (ZYRTEC) 10 MG tablet Take 10 mg by mouth daily.   Cholecalciferol (VITAMIN D3) 5000 units TABS Take 1 tablet by mouth daily.   Multiple Vitamin (MULTIVITAMIN) tablet Take 1 tablet by mouth daily.   Omega-3 Fatty Acids (FISH OIL PO) Take 1 tablet by mouth daily.   triamterene-hydrochlorothiazide (MAXZIDE-25) 37.5-25 MG tablet TAKE 1 TABLET BY MOUTH EVERY DAY   zaleplon (SONATA) 10 MG capsule TAKE 1 CAPSULE (10 MG TOTAL) BY MOUTH AT BEDTIME AS NEEDED FOR SLEEP.       09/09/2023   10:51 AM 06/03/2023    9:49 AM 11/29/2022    8:17 AM 05/27/2022    8:28 AM  GAD 7 : Generalized Anxiety Score  Nervous, Anxious, on Edge 0 0 0 0  Control/stop worrying 0 0 0 0  Worry too much - different things 0 0 0 0  Trouble relaxing 0 0 0 0  Restless 0 0 0 0  Easily annoyed or irritable 0 0 0 0  Afraid - awful might happen 0 0 0 0  Total GAD 7 Score 0 0 0 0  Anxiety Difficulty Not difficult at all Not difficult at all Not difficult at all Not difficult at all       09/09/2023   10:51 AM 06/03/2023    9:49 AM 03/23/2023    1:46 PM  Depression screen PHQ 2/9  Decreased Interest 0 0 0  Down, Depressed, Hopeless 0 0 0  PHQ - 2 Score 0 0 0  Altered sleeping 0 0   Tired, decreased energy 0 0   Change in appetite 0 0   Feeling bad or failure about yourself  0 0   Trouble concentrating 0 0   Moving slowly or fidgety/restless 0 0   Suicidal thoughts 0 0   PHQ-9 Score 0 0   Difficult doing work/chores Not difficult at all Not difficult at all     BP Readings from Last 3 Encounters:  09/09/23 122/72  08/17/23 122/66  08/09/23 132/60    Physical Exam Vitals and nursing note reviewed.  Constitutional:      General: She is not in acute distress.    Appearance: She is well-developed.  HENT:     Head: Normocephalic and atraumatic.  Cardiovascular:     Rate and Rhythm: Normal rate and regular rhythm.  Pulmonary:     Effort: Pulmonary effort is normal. No respiratory distress.     Breath sounds: No  wheezing or rhonchi.  Musculoskeletal:     Cervical back: Normal range of motion.     Right lower leg: No edema.     Left lower leg: No edema.  Skin:    General: Skin is warm and dry.     Findings: No rash.  Neurological:     Mental Status: She is alert and oriented to person, place, and time.  Psychiatric:        Mood and Affect: Mood normal.        Behavior: Behavior normal.     Wt Readings from Last 3 Encounters:  09/09/23 178 lb (80.7 kg)  08/09/23 174 lb 12.8 oz (79.3 kg)  07/12/23 175 lb (79.4 kg)    BP 122/72   Pulse 81   Ht 5' (1.524 m)   Wt 178 lb (80.7 kg)   SpO2 95%   BMI 34.76 kg/m   Assessment and Plan:  Problem List Items Addressed This Visit       Unprioritized   Essential hypertension - Primary (Chronic)    Normal exam with stable BP on amlodipine and hctz. No concerns or side effects to current medication. Continue hydration; recent GFR 63 No change in regimen; continue low sodium diet.       Coronary artery disease involving native coronary artery of native heart without angina pectoris    Coronary calcium CT: 1. Coronary calcium score of 5.08. This was 63rd percentile for age and sex matched control.  2. Normal coronary origin with right dominance.  3. Minimal proximal LAD disease (<25%).  4. CAD-RADS 1. Minimal non-obstructive CAD (0-24%). Consider preventive therapy and risk factor modification. ECHO - mild bystolic and systolic dysfunction; EF 65%      Other Visit Diagnoses     Need for influenza vaccination       Relevant Orders   Flu Vaccine Trivalent High Dose (Fluad) (Completed)       Return in about 9 months (around 06/08/2024) for CPX.    Reubin Milan, MD [facility] [place]

## 2023-09-09 NOTE — Assessment & Plan Note (Addendum)
Normal exam with stable BP on amlodipine and hctz. No concerns or side effects to current medication. Continue hydration; recent GFR 63 No change in regimen; continue low sodium diet.

## 2023-09-16 ENCOUNTER — Encounter: Payer: Self-pay | Admitting: Cardiology

## 2023-09-16 ENCOUNTER — Ambulatory Visit: Payer: PPO | Attending: Physician Assistant | Admitting: Cardiology

## 2023-09-16 VITALS — BP 134/76 | HR 71 | Ht 60.0 in | Wt 178.2 lb

## 2023-09-16 DIAGNOSIS — I2510 Atherosclerotic heart disease of native coronary artery without angina pectoris: Secondary | ICD-10-CM

## 2023-09-16 DIAGNOSIS — I10 Essential (primary) hypertension: Secondary | ICD-10-CM

## 2023-09-16 MED ORDER — OMEPRAZOLE 20 MG PO CPDR: 20.0000 mg | DELAYED_RELEASE_CAPSULE | Freq: Every day | ORAL | 11 refills | Status: AC

## 2023-09-16 NOTE — Progress Notes (Signed)
Cardiology Office Note:    Date:  09/16/2023   ID:  Chelsea [name], DOB [birth year], MRN [id number]  PCP:  Reubin Milan, MD   [facility] Providers Cardiologist:  Debbe Odea, MD     Referring MD: Reubin Milan, MD   Chief Complaint  Patient presents with   Follow-up    Discuss cardiac testing results.  Patient denies new or acute cardiac problems/concerns today.      History of Present Illness:    Chelsea [name] is a 67 y.o. female with a hx of hypertension who presents for follow-up.  Previously seen due to chest pain.  Echo and coronary CT obtained to evaluate cardiac etiology.  Presents for cardiac testing results.  Still has mid central chest discomfort, not associated with exertion of foods.  Not sure if she has a history of heartburn.  Denies any new concerns at this time.   Past Medical History:  Diagnosis Date   Anemia    as a child   Diverticulosis    Hypertension     Past Surgical History:  Procedure Laterality Date   arm surgery     BREAST BIOPSY Right 2015   papilloma   BREAST EXCISIONAL BIOPSY Right 2015   neg   CESAREAN SECTION     COLONOSCOPY     GANGLION CYST EXCISION Right    KNEE ARTHROSCOPY WITH MEDIAL MENISECTOMY Left 04/22/2016   Procedure: KNEE ARTHROSCOPY WITH MEDIAL MENISECTOMY;  Surgeon: Kennedy Bucker, MD;  Location: [facility];  Service: Orthopedics;  Laterality: Left;   LEEP N/A 07/19/2022   Procedure: LOOP ELECTROSURGICAL EXCISION PROCEDURE (LEEP);  Surgeon: Linzie Collin, MD;  Location: [facility];  Service: Gynecology;  Laterality: N/A;   TONSILLECTOMY     TRIGGER FINGER RELEASE Right     Current Medications: Current Meds  Medication Sig   amLODipine (NORVASC) 10 MG tablet TAKE 1 TABLET BY MOUTH EVERY DAY   aspirin EC 81 MG tablet Take 81 mg by mouth daily. Swallow whole.   cetirizine (ZYRTEC) 10 MG tablet Take 10 mg by mouth daily.   Cholecalciferol (VITAMIN D3) 5000 units TABS Take 1 tablet by mouth  daily.   Multiple Vitamin (MULTIVITAMIN) tablet Take 1 tablet by mouth daily.   Omega-3 Fatty Acids (FISH OIL PO) Take 1 tablet by mouth daily.   omeprazole (PRILOSEC) 20 MG capsule Take 1 capsule (20 mg total) by mouth daily.   triamterene-hydrochlorothiazide (MAXZIDE-25) 37.5-25 MG tablet TAKE 1 TABLET BY MOUTH EVERY DAY   zaleplon (SONATA) 10 MG capsule TAKE 1 CAPSULE (10 MG TOTAL) BY MOUTH AT BEDTIME AS NEEDED FOR SLEEP.     Allergies:   Patient has no known allergies.   Social History   Socioeconomic History   Marital status: Married    Spouse name: Earlene Plater   Number of children: 2   Years of education: Not on file   Highest education level: Associate degree: academic program  Occupational History   Not on file  Tobacco Use   Smoking status: Never    Passive exposure: Never   Smokeless tobacco: Never  Vaping Use   Vaping status: Never Used  Substance and Sexual Activity   Alcohol use: No   Drug use: No   Sexual activity: Yes    Birth control/protection: Post-menopausal  Other Topics Concern   Not on file  Social History Narrative   Not on file   Social Determinants of Health   Financial Resource Strain: Low Risk  (  06/03/2023)   Overall Financial Resource Strain (CARDIA)    Difficulty of Paying Living Expenses: Not very hard  Food Insecurity: Food Insecurity Present (06/03/2023)   Hunger Vital Sign    Worried About Running Out of Food in the Last Year: Sometimes true    Ran Out of Food in the Last Year: Sometimes true  Transportation Needs: No Transportation Needs (06/03/2023)   PRAPARE - Administrator, Civil Service (Medical): No    Lack of Transportation (Non-Medical): No  Physical Activity: Sufficiently Active (06/03/2023)   Exercise Vital Sign    Days of Exercise per Week: 3 days    Minutes of Exercise per Session: 60 min  Stress: No Stress Concern Present (06/03/2023)   [employer] of Occupational Health - Occupational Stress Questionnaire     Feeling of Stress : Not at all  Social Connections: Socially Integrated (06/03/2023)   Social Connection and Isolation Panel [NHANES]    Frequency of Communication with Friends and Family: More than three times a week    Frequency of Social Gatherings with Friends and Family: More than three times a week    Attends Religious Services: More than 4 times per year    Active Member of [employer] or Organizations: Yes    Attends Engineer, structural: More than 4 times per year    Marital Status: Married     Family History: The patient's family history includes Breast cancer in her mother; Diabetes in her father; Heart attack (age of onset: 56) in her mother; Kidney disease in her father.  ROS:   Please see the history of present illness.     All other systems reviewed and are negative.  EKGs/Labs/Other Studies Reviewed:    The following studies were reviewed today:       Recent Labs: 06/03/2023: ALT 16; Hemoglobin 11.2; Platelets 308; TSH 1.390 08/09/2023: BUN 18; Creatinine, Ser 0.99; Potassium 4.1; Sodium 144  Recent Lipid Panel    Component Value Date/Time   CHOL 179 06/03/2023 1110   TRIG 81 06/03/2023 1110   HDL 54 06/03/2023 1110   CHOLHDL 3.3 06/03/2023 1110   LDLCALC 110 (H) 06/03/2023 1110     Risk Assessment/Calculations:             Physical Exam:    VS:  BP 134/76 (BP Location: Left Arm, Patient Position: Sitting, Cuff Size: Large)   Pulse 71   Ht 5' (1.524 m)   Wt 178 lb 3.2 oz (80.8 kg)   SpO2 99%   BMI 34.80 kg/m     Wt Readings from Last 3 Encounters:  09/16/23 178 lb 3.2 oz (80.8 kg)  09/09/23 178 lb (80.7 kg)  08/09/23 174 lb 12.8 oz (79.3 kg)     GEN:  Well nourished, well developed in no acute distress HEENT: Normal NECK: No JVD; No carotid bruits CARDIAC: RRR, no murmurs, rubs, gallops RESPIRATORY:  Clear to auscultation without rales, wheezing or rhonchi  ABDOMEN: Soft, non-tender, non-distended MUSCULOSKELETAL:  No edema; No  deformity  SKIN: Warm and dry NEUROLOGIC:  Alert and oriented x 3 PSYCHIATRIC:  Normal affect   ASSESSMENT:    1. Coronary artery calcification   2. Primary hypertension    PLAN:    In order of problems listed above:  Chest pain, epigastric location.  Coronary CT 9/24 showing minimal proximal LAD disease (<25%), calcium score 5.08.  Echo with normal systolic function.  Patient made aware of results, reassured.  Okay to continue aspirin  81 mg daily.  Trial of OTC omeprazole 20 mg daily.  Follow-up with PCP. Hypertension, BP controlled.  Continue Norvasc 10 mg daily, Maxzide  Follow-up yearly or as needed.     Medication Adjustments/Labs and Tests Ordered: Current medicines are reviewed at length with the patient today.  Concerns regarding medicines are outlined above.  No orders of the defined types were placed in this encounter.  Meds ordered this encounter  Medications   omeprazole (PRILOSEC) 20 MG capsule    Sig: Take 1 capsule (20 mg total) by mouth daily.    Dispense:  30 capsule    Refill:  11    Patient Instructions  Medication Instructions:  Your physician has recommended you make the following change in your medication:  START - omeprazole (PRILOSEC) 20 MG capsule - Take 1 capsule (20 mg total) by mouth daily.  *If you need a refill on your cardiac medications before your next appointment, please call your pharmacy*  Lab Work: -None ordered  Testing/Procedures: -None ordered  Follow-Up: At [facility], you and your health needs are our priority.  As part of our continuing mission to provide you with exceptional heart care, we have created designated Provider Care Teams.  These Care Teams include your primary Cardiologist (physician) and Advanced Practice Providers (APPs -  Physician Assistants and Nurse Practitioners) who all work together to provide you with the care you need, when you need it.  Your next appointment:   Follow-up as needed    Provider:   You may see Debbe Odea, MD or one of the following Advanced Practice Providers on your designated Care Team:   Nicolasa Ducking, NP Eula Listen, PA-C Cadence Fransico Michael, PA-C Charlsie Quest, NP    Other Instructions -None    Signed, Debbe Odea, MD  09/16/2023 4:26 PM    [facility]

## 2023-09-16 NOTE — Patient Instructions (Signed)
Medication Instructions:  Your physician has recommended you make the following change in your medication:  START - omeprazole (PRILOSEC) 20 MG capsule - Take 1 capsule (20 mg total) by mouth daily.  *If you need a refill on your cardiac medications before your next appointment, please call your pharmacy*  Lab Work: -None ordered  Testing/Procedures: -None ordered  Follow-Up: At [facility], you and your health needs are our priority.  As part of our continuing mission to provide you with exceptional heart care, we have created designated Provider Care Teams.  These Care Teams include your primary Cardiologist (physician) and Advanced Practice Providers (APPs -  Physician Assistants and Nurse Practitioners) who all work together to provide you with the care you need, when you need it.  Your next appointment:   Follow-up as needed   Provider:   You may see Debbe Odea, MD or one of the following Advanced Practice Providers on your designated Care Team:   Nicolasa Ducking, NP Eula Listen, PA-C Cadence Fransico Michael, PA-C Charlsie Quest, NP    Other Instructions -None

## 2023-09-19 ENCOUNTER — Ambulatory Visit: Payer: PPO | Admitting: Internal Medicine

## 2023-12-14 ENCOUNTER — Other Ambulatory Visit: Payer: Self-pay | Admitting: Internal Medicine

## 2023-12-14 DIAGNOSIS — I10 Essential (primary) hypertension: Secondary | ICD-10-CM

## 2023-12-15 ENCOUNTER — Other Ambulatory Visit: Payer: Self-pay | Admitting: Internal Medicine

## 2023-12-15 DIAGNOSIS — I10 Essential (primary) hypertension: Secondary | ICD-10-CM

## 2023-12-17 NOTE — Telephone Encounter (Signed)
 Requested Prescriptions  Pending Prescriptions Disp Refills   triamterene -hydrochlorothiazide (MAXZIDE-25) 37.5-25 MG tablet [Pharmacy Med Name: TRIAMTERENE -HCTZ 37.5-25 MG TB] 90 tablet 0    Sig: TAKE 1 TABLET BY MOUTH EVERY DAY     Cardiovascular: Diuretic Combos Passed - 12/17/2023 12:38 PM      Passed - K in normal range and within 180 days    Potassium  Date Value Ref Range Status  08/09/2023 4.1 3.5 - 5.2 mmol/L Final  11/22/2014 4.1 3.5 - 5.1 mmol/L Final         Passed - Na in normal range and within 180 days    Sodium  Date Value Ref Range Status  08/09/2023 144 134 - 144 mmol/L Final  11/22/2014 141 136 - 145 mmol/L Final         Passed - Cr in normal range and within 180 days    Creatinine  Date Value Ref Range Status  11/22/2014 1.02 0.60 - 1.30 mg/dL Final   Creatinine, Ser  Date Value Ref Range Status  08/09/2023 0.99 0.57 - 1.00 mg/dL Final         Passed - Last BP in normal range    BP Readings from Last 1 Encounters:  09/16/23 134/76         Passed - Valid encounter within last 6 months    Recent Outpatient Visits           3 months ago Essential hypertension   [place] [facility] at [facility], Leita DEL, MD   6 months ago Essential hypertension   [place] [facility] at [facility], Leita DEL, MD   1 year ago Essential hypertension   [place] [facility] at [facility], Leita DEL, MD   1 year ago Annual physical exam   [facility] at [facility], Leita DEL, MD   2 years ago Essential hypertension   [facility] at [facility], Leita DEL, MD       Future Appointments             In 5 months Justus, Leita DEL, MD [facility] at [facility], [facility]

## 2023-12-19 NOTE — Telephone Encounter (Signed)
 Requested Prescriptions  Pending Prescriptions Disp Refills   amLODipine  (NORVASC ) 10 MG tablet [Pharmacy Med Name: AMLODIPINE  BESYLATE 10 MG TAB] 90 tablet 1    Sig: TAKE 1 TABLET BY MOUTH EVERY DAY     Cardiovascular: Calcium  Channel Blockers 2 Passed - 12/19/2023 12:56 PM      Passed - Last BP in normal range    BP Readings from Last 1 Encounters:  09/16/23 134/76         Passed - Last Heart Rate in normal range    Pulse Readings from Last 1 Encounters:  09/16/23 71         Passed - Valid encounter within last 6 months    Recent Outpatient Visits           3 months ago Essential hypertension   [place] [facility] at [facility], Leita DEL, MD   6 months ago Essential hypertension   [place] [facility] at [facility], Leita DEL, MD   1 year ago Essential hypertension   [place] [facility] at [facility], Leita DEL, MD   1 year ago Annual physical exam   [facility] at [facility], Leita DEL, MD   2 years ago Essential hypertension   [facility] at [facility], Leita DEL, MD       Future Appointments             In 5 months Justus, Leita DEL, MD [facility] Medicine at [facility], [facility]

## 2024-02-06 ENCOUNTER — Other Ambulatory Visit: Payer: Self-pay | Admitting: Internal Medicine

## 2024-02-06 DIAGNOSIS — G4709 Other insomnia: Secondary | ICD-10-CM

## 2024-02-07 NOTE — Telephone Encounter (Signed)
 Requested medication (s) are due for refill today: yes   Requested medication (s) are on the active medication list: yes   Last refill:  07/02/23 #30 1 refills  Future visit scheduled: yes in 4 months  Notes to clinic:  not delegated per protocol. Do you want to refill Rx?     Requested Prescriptions  Pending Prescriptions Disp Refills   zaleplon (SONATA) 10 MG capsule [Pharmacy Med Name: ZALEPLON 10 MG CAPSULE] 30 capsule     Sig: TAKE 1 CAPSULE (10 MG TOTAL) BY MOUTH AT BEDTIME AS NEEDED FOR SLEEP.     Not Delegated - Psychiatry:  Anxiolytics/Hypnotics Failed - 02/07/2024  3:38 PM      Failed - This refill cannot be delegated      Failed - Urine Drug Screen completed in last 360 days      Passed - Valid encounter within last 6 months    Recent Outpatient Visits           5 months ago Essential hypertension   [place] [facility] at [facility], Nyoka Cowden, MD   8 months ago Essential hypertension   [place] [facility] at [facility], Nyoka Cowden, MD   1 year ago Essential hypertension    Primary Care & Sports Medicine at [facility], Nyoka Cowden, MD   1 year ago Annual physical exam   [facility] at [facility], Nyoka Cowden, MD   2 years ago Essential hypertension   [facility] Care & Sports Medicine at [facility], Nyoka Cowden, MD       Future Appointments             In 4 months Judithann Graves, Nyoka Cowden, MD [facility] Primary Care & Sports Medicine at [facility], [facility]

## 2024-04-27 ENCOUNTER — Other Ambulatory Visit: Payer: Self-pay | Admitting: Internal Medicine

## 2024-04-27 DIAGNOSIS — I10 Essential (primary) hypertension: Secondary | ICD-10-CM

## 2024-04-27 NOTE — Telephone Encounter (Signed)
 Copied from CRM [id number]. Topic: Clinical - Medication Refill >> Apr 27, 2024  8:21 AM Chrystal Crape R wrote: Medication: triamterene -hydrochlorothiazide (MAXZIDE-25) 37.5-25 MG tablet  Has the patient contacted their pharmacy? Yes, Need to reach out to pcp for refill.  (Agent: If no, request that the patient contact the pharmacy for the refill. If patient does not wish to contact the pharmacy document the reason why and proceed with request.) (Agent: If yes, when and what did the pharmacy advise?)  This is the patient's preferred pharmacy:  [id number] - [place], [place] - [address] [place] [address] Phone: [phone] Fax: [phone]  Is this the correct pharmacy for this prescription? Yes If no, delete pharmacy and type the correct one.   Has the prescription been filled recently? No  Is the patient out of the medication? No, 3 left   Has the patient been seen for an appointment in the last year OR does the patient have an upcoming appointment? Yes  Can we respond through MyChart? Yes  Agent: Please be advised that Rx refills may take up to 3 business days. We ask that you follow-up with your pharmacy.

## 2024-04-30 NOTE — Telephone Encounter (Signed)
 Requested medication (s) are due for refill today: yes  Requested medication (s) are on the active medication list: yes  Last refill:  12/17/23 #90  Future visit scheduled: yes  Notes to clinic:  overdue lab work   Requested Prescriptions  Pending Prescriptions Disp Refills   triamterene -hydrochlorothiazide (MAXZIDE-25) 37.5-25 MG tablet 90 tablet 0    Sig: Take 1 tablet by mouth daily.     Cardiovascular: Diuretic Combos Failed - 04/30/2024  2:53 PM      Failed - K in normal range and within 180 days    Potassium  Date Value Ref Range Status  08/09/2023 4.1 3.5 - 5.2 mmol/L Final  11/22/2014 4.1 3.5 - 5.1 mmol/L Final         Failed - Na in normal range and within 180 days    Sodium  Date Value Ref Range Status  08/09/2023 144 134 - 144 mmol/L Final  11/22/2014 141 136 - 145 mmol/L Final         Failed - Cr in normal range and within 180 days    Creatinine  Date Value Ref Range Status  11/22/2014 1.02 0.60 - 1.30 mg/dL Final   Creatinine, Ser  Date Value Ref Range Status  08/09/2023 0.99 0.57 - 1.00 mg/dL Final         Failed - Valid encounter within last 6 months    Recent Outpatient Visits   None     Future Appointments             In 1 month Gala Jubilee, [place]ales [place], MD [facility] Primary Care & Sports Medicine at [facility], [facility]            Passed - Last BP in normal range    BP Readings from Last 1 Encounters:  09/16/23 134/76

## 2024-05-02 ENCOUNTER — Ambulatory Visit (INDEPENDENT_AMBULATORY_CARE_PROVIDER_SITE_OTHER): Admitting: Emergency Medicine

## 2024-05-02 VITALS — Ht 60.0 in | Wt 180.0 lb

## 2024-05-02 DIAGNOSIS — Z780 Asymptomatic menopausal state: Secondary | ICD-10-CM

## 2024-05-02 DIAGNOSIS — Z1231 Encounter for screening mammogram for malignant neoplasm of breast: Secondary | ICD-10-CM

## 2024-05-02 DIAGNOSIS — Z0000 Encounter for general adult medical examination without abnormal findings: Secondary | ICD-10-CM

## 2024-05-02 NOTE — Progress Notes (Signed)
 Subjective:   Chelsea [name] is a 68 y.o. who presents for a Medicare Wellness preventive visit.  As a reminder, Annual Wellness Visits don't include a physical exam, and some assessments may be limited, especially if this visit is performed virtually. We may recommend an in-person follow-up visit with your provider if needed.  Visit Complete: Virtual I connected with  Chelsea [name] on 05/02/24 by a audio enabled telemedicine application and verified that I am speaking with the correct person using two identifiers.  Patient Location: Home  Provider Location: Home Office  I discussed the limitations of evaluation and management by telemedicine. The patient expressed understanding and agreed to proceed.  Vital Signs: Because this visit was a virtual/telehealth visit, some criteria may be missing or patient reported. Any vitals not documented were not able to be obtained and vitals that have been documented are patient reported.  VideoDeclined- This patient declined Librarian, academic. Therefore the visit was completed with audio only.  Persons Participating in Visit: Patient.  AWV Questionnaire: No: Patient Medicare AWV questionnaire was not completed prior to this visit.  Cardiac Risk Factors include: advanced age (>74men, >81 women);hypertension;dyslipidemia;obesity (BMI >30kg/m2);Other (see comment), Risk factor comments: CAD     Objective:     Today's Vitals   05/02/24 1048  Weight: 180 lb (81.6 kg)  Height: 5' (1.524 m)   Body mass index is 35.15 kg/m.     05/02/2024   10:57 AM 03/23/2023    1:45 PM 07/19/2022    9:37 AM 07/16/2022    8:45 AM  Advanced Directives  Does Patient Have a Medical Advance Directive? No No No No  Would patient like information on creating a medical advance directive? Yes (MAU/Ambulatory/Procedural Areas - Information given)       Current Medications (verified) Outpatient Encounter Medications as of 05/02/2024   Medication Sig   amLODipine  (NORVASC ) 10 MG tablet TAKE 1 TABLET BY MOUTH EVERY DAY   aspirin EC 81 MG tablet Take 81 mg by mouth daily. Swallow whole.   cetirizine (ZYRTEC) 10 MG tablet Take 10 mg by mouth daily.   Cholecalciferol (VITAMIN D3) 5000 units TABS Take 1 tablet by mouth daily.   Multiple Vitamin (MULTIVITAMIN) tablet Take 1 tablet by mouth daily.   Omega-3 Fatty Acids (FISH OIL PO) Take 1 tablet by mouth daily.   omeprazole  (PRILOSEC) 20 MG capsule Take 1 capsule (20 mg total) by mouth daily.   triamterene -hydrochlorothiazide (MAXZIDE-25) 37.5-25 MG tablet TAKE 1 TABLET BY MOUTH EVERY DAY   zaleplon  (SONATA ) 10 MG capsule TAKE 1 CAPSULE (10 MG TOTAL) BY MOUTH AT BEDTIME AS NEEDED FOR SLEEP.   [DISCONTINUED] hydrochlorothiazide (HYDRODIURIL) 25 MG tablet Take 25 mg by mouth daily.   No facility-administered encounter medications on file as of 05/02/2024.    Allergies (verified) Patient has no known allergies.   History: Past Medical History:  Diagnosis Date   Anemia    as a child   Diverticulosis    Hypertension    Past Surgical History:  Procedure Laterality Date   arm surgery     BREAST BIOPSY Right 2015   papilloma   BREAST EXCISIONAL BIOPSY Right 2015   neg   CESAREAN SECTION     COLONOSCOPY     GANGLION CYST EXCISION Right    KNEE ARTHROSCOPY WITH MEDIAL MENISECTOMY Left 04/22/2016   Procedure: KNEE ARTHROSCOPY WITH MEDIAL MENISECTOMY;  Surgeon: Molli Angelucci, MD;  Location: [facility] ORS;  Service: Orthopedics;  Laterality: Left;  LEEP N/A 07/19/2022   Procedure: LOOP ELECTROSURGICAL EXCISION PROCEDURE (LEEP);  Surgeon: Zenobia Hila, MD;  Location: [facility] ORS;  Service: Gynecology;  Laterality: N/A;   TONSILLECTOMY     TRIGGER FINGER RELEASE Right    Family History  Problem Relation Age of Onset   Heart attack Mother 39   Breast cancer Mother    Kidney disease Father    Diabetes Father    Social History   Socioeconomic History   Marital status:  Married    Spouse name: Lajuana Pilar   Number of children: 2   Years of education: Not on file   Highest education level: Associate degree: academic program  Occupational History   Occupation: retired  Tobacco Use   Smoking status: Never    Passive exposure: Never   Smokeless tobacco: Never  Vaping Use   Vaping status: Never Used  Substance and Sexual Activity   Alcohol use: No   Drug use: No   Sexual activity: Yes    Birth control/protection: Post-menopausal  Other Topics Concern   Not on file  Social History Narrative   Not on file   Social Drivers of Health   Financial Resource Strain: Low Risk  (05/02/2024)   Overall Financial Resource Strain (CARDIA)    Difficulty of Paying Living Expenses: Not hard at all  Food Insecurity: No Food Insecurity (05/02/2024)   Hunger Vital Sign    Worried About Running Out of Food in the Last Year: Never true    Ran Out of Food in the Last Year: Never true  Transportation Needs: No Transportation Needs (05/02/2024)   PRAPARE - Administrator, Civil Service (Medical): No    Lack of Transportation (Non-Medical): No  Physical Activity: Sufficiently Active (05/02/2024)   Exercise Vital Sign    Days of Exercise per Week: 3 days    Minutes of Exercise per Session: 60 min  Stress: No Stress Concern Present (05/02/2024)   [employer] of Occupational Health - Occupational Stress Questionnaire    Feeling of Stress : Not at all  Social Connections: Moderately Integrated (05/02/2024)   Social Connection and Isolation Panel [NHANES]    Frequency of Communication with Friends and Family: More than three times a week    Frequency of Social Gatherings with Friends and Family: More than three times a week    Attends Religious Services: More than 4 times per year    Active Member of [employer] or Organizations: No    Attends Engineer, structural: Never    Marital Status: Married    Tobacco Counseling Counseling given:  No    Clinical Intake:  Pre-visit preparation completed: Yes  Pain : No/denies pain     BMI - recorded: 35.15 Nutritional Status: BMI > 30  Obese Nutritional Risks: None Diabetes: No  Lab Results  Component Value Date   HGBA1C 5.4 03/09/2022   HGBA1C 5.4 10/14/2020   HGBA1C 5.1 10/09/2019     How often do you need to have someone help you when you read instructions, pamphlets, or other written materials from your doctor or pharmacy?: 1 - Never  Interpreter Needed?: No  Information entered by :: Jaunita Messier, CMA   Activities of Daily Living     05/02/2024   10:50 AM  In your present state of health, do you have any difficulty performing the following activities:  Hearing? 1  Comment wears hearing aids  Vision? 0  Difficulty concentrating or making decisions? 0  Walking or  climbing stairs? 0  Dressing or bathing? 0  Doing errands, shopping? 0  Preparing Food and eating ? N  Using the Toilet? N  In the past six months, have you accidently leaked urine? Y  Comment wears a pad  Do you have problems with loss of bowel control? N  Managing your Medications? N  Managing your Finances? N  Housekeeping or managing your Housekeeping? N    Patient Care Team: Sheron Dixons, MD as PCP - General (Internal Medicine) Constancia Delton, MD as PCP - Cardiology (Cardiology) Zenobia Hila, MD as Consulting Physician (Obstetrics and Gynecology) Molli Angelucci, MD as Consulting Physician (Orthopedic Surgery) Dingeldein, Landon Pinion, MD (Ophthalmology)  Indicate any recent Medical Services you may have received from other than [facility] providers in the past year (date may be approximate).     Assessment:    This is a routine wellness examination for [facility].  Hearing/Vision screen Hearing Screening - Comments:: Wears hearing aids Vision Screening - Comments:: Gets routine eye exams, Dr. Janeice Medal, [facility]   Goals Addressed               This Visit's  Progress     Weight (lb) < 160 lb (72.6 kg) (pt-stated)   180 lb (81.6 kg)      Depression Screen     05/02/2024   10:55 AM 09/09/2023   10:51 AM 06/03/2023    9:49 AM 03/23/2023    1:46 PM 11/29/2022    8:17 AM 08/13/2022   10:59 AM 05/27/2022    8:28 AM  PHQ 2/9 Scores  PHQ - 2 Score 0 0 0 0 0 0 0  PHQ- 9 Score 0 0 0  0  0    Fall Risk     05/02/2024   10:59 AM 09/09/2023   10:50 AM 06/03/2023    9:49 AM 03/23/2023    1:46 PM 11/29/2022    8:17 AM  Fall Risk   Falls in the past year? 0 0 1 0 0  Number falls in past yr: 0 0 0 0 0  Injury with Fall? 0 0 0 0 0  Risk for fall due to : No Fall Risks No Fall Risks No Fall Risks No Fall Risks No Fall Risks  Follow up Falls evaluation completed Falls evaluation completed Falls evaluation completed Falls evaluation completed Falls evaluation completed    MEDICARE RISK AT HOME:  Medicare Risk at Home Any stairs in or around the home?: Yes If so, are there any without handrails?: No Home free of loose throw rugs in walkways, pet beds, electrical cords, etc?: Yes Adequate lighting in your home to reduce risk of falls?: Yes Life alert?: No Use of a cane, walker or w/c?: No Grab bars in the bathroom?: No Shower chair or bench in shower?: No Elevated toilet seat or a handicapped toilet?: No  TIMED UP AND GO:  Was the test performed?  No  Cognitive Function: 6CIT completed        05/02/2024   11:00 AM 03/23/2023    1:46 PM  6CIT Screen  What Year? 0 points 0 points  What month? 0 points 0 points  What time? 0 points 0 points  Count back from 20 0 points 0 points  Months in reverse 0 points 0 points  Repeat phrase 0 points 2 points  Total Score 0 points 2 points    Immunizations Immunization History  Administered Date(s) Administered   Fluad Quad(high Dose 65+) 11/29/2022  Fluad Trivalent(High Dose 65+) 09/09/2023   Influenza,inj,Quad PF,6+ Mos 10/09/2019, 10/14/2020, 10/26/2021   Influenza-Unspecified 09/09/2016,  09/28/2017, 09/26/2018   Moderna Sars-Covid-2 Vaccination 07/17/2020, 08/14/2020   PNEUMOCOCCAL CONJUGATE-20 11/29/2022   Tdap 05/27/2022    Screening Tests Health Maintenance  Topic Date Due   Zoster Vaccines- Shingrix (1 of 2) Never done   DEXA SCAN  Never done   COVID-19 Vaccine (3 - 2024-25 season) 08/14/2023   MAMMOGRAM  06/20/2024   INFLUENZA VACCINE  07/13/2024   Medicare Annual Wellness (AWV)  05/02/2025   Colonoscopy  08/19/2025   DTaP/Tdap/Td (2 - Td or Tdap) 05/27/2032   Pneumonia Vaccine 73+ Years old  Completed   Hepatitis C Screening  Completed   HPV VACCINES  Aged Out   Meningococcal B Vaccine  Aged Out    Health Maintenance  Health Maintenance Due  Topic Date Due   Zoster Vaccines- Shingrix (1 of 2) Never done   DEXA SCAN  Never done   COVID-19 Vaccine (3 - 2024-25 season) 08/14/2023   Health Maintenance Items Addressed: Mammogram ordered, DEXA ordered, See Nurse Notes  Additional Screening:  Vision Screening: Recommended annual ophthalmology exams for early detection of glaucoma and other disorders of the eye.  Dental Screening: Recommended annual dental exams for proper oral hygiene  Community Resource Referral / Chronic Care Management: CRR required this visit?  No   CCM required this visit?  No   Plan:    I have personally reviewed and noted the following in the patient's chart:   Medical and social history Use of alcohol, tobacco or illicit drugs  Current medications and supplements including opioid prescriptions. Patient is not currently taking opioid prescriptions. Functional ability and status Nutritional status Physical activity Advanced directives List of other physicians Hospitalizations, surgeries, and ER visits in previous 12 months Vitals Screenings to include cognitive, depression, and falls Referrals and appointments  In addition, I have reviewed and discussed with patient certain preventive protocols, quality metrics,  and best practice recommendations. A written personalized care plan for preventive services as well as general preventive health recommendations were provided to patient.   Jaunita Messier, CMA   05/02/2024   After Visit Summary: (MyChart) Due to this being a telephonic visit, the after visit summary with patients personalized plan was offered to patient via MyChart   Notes: Please refer to Routing Comments.

## 2024-05-02 NOTE — Patient Instructions (Signed)
 [name] , Thank you for taking time out of your busy schedule to complete your Annual Wellness Visit with me. I enjoyed our conversation and look forward to speaking with you again next year. I, as well as your care team,  appreciate your ongoing commitment to your health goals. Please review the following plan we discussed and let me know if I can assist you in the future. Your Game plan/ To Do List    Referrals: I have placed an order for a mammogram (due 06/21/24) and bone density test. You may request for these to be scheduled at the same visit. Call [facility] @ [phone] to schedule at your convenience.  Follow up Visits: Next Medicare AWV with our clinical staff: 05/08/25 @ 10:50am (phone visit)   Have you seen your provider in the last 6 months (3 months if uncontrolled diabetes)? No Next Office Visit with your provider: 06/08/24 @ 11:00am with Dr. Gala Jubilee  Clinician Recommendations:  Aim for 30 minutes of exercise or brisk walking, 6-8 glasses of water, and 5 servings of fruits and vegetables each day.       This is a list of the screening recommended for you and due dates:  Health Maintenance  Topic Date Due   Zoster (Shingles) Vaccine (1 of 2) Never done   DEXA scan (bone density measurement)  Never done   COVID-19 Vaccine (3 - 2024-25 season) 08/14/2023   Mammogram  06/20/2024   Flu Shot  07/13/2024   Medicare Annual Wellness Visit  05/02/2025   Colon Cancer Screening  08/19/2025   DTaP/Tdap/Td vaccine (2 - Td or Tdap) 05/27/2032   Pneumonia Vaccine  Completed   Hepatitis C Screening  Completed   HPV Vaccine  Aged Out   Meningitis B Vaccine  Aged Out    Advanced directives: (ACP Link)Information on Advanced Care Planning can be found at [place]  Secretary of [facility] Advance Health Care Directives Advance Health Care Directives. [email or web address] You may also get these forms at your doctor's office. Advance Care Planning is important because it:  [x]  Makes  sure you receive the medical care that is consistent with your values, goals, and preferences  [x]  It provides guidance to your family and loved ones and reduces their decisional burden about whether or not they are making the right decisions based on your wishes.  Follow the link provided in your after visit summary or read over the paperwork we have mailed to you to help you started getting your Advance Directives in place. If you need assistance in completing these, please reach out to us  so that we can help you!  See attachments for Preventive Care and Fall Prevention Tips.   Fall Prevention in the Home, Adult Falls can cause injuries and affect people of all ages. There are many simple things that you can do to make your home safe and to help prevent falls. If you need it, ask for help making these changes. What actions can I take to prevent falls? General information Use good lighting in all rooms. Make sure to: Replace any light bulbs that burn out. Turn on lights if it is dark and use night-lights. Keep items that you use often in easy-to-reach places. Lower the shelves around your home if needed. Move furniture so that there are clear paths around it. Do not keep throw rugs or other things on the floor that can make you trip. If any of your floors are uneven, fix them. Add color or contrast  paint or tape to clearly mark and help you see: Grab bars or handrails. First and last steps of staircases. Where the edge of each step is. If you use a ladder or stepladder: Make sure that it is fully opened. Do not climb a closed ladder. Make sure the sides of the ladder are locked in place. Have someone hold the ladder while you use it. Know where your pets are as you move through your home. What can I do in the bathroom?     Keep the floor dry. Clean up any water that is on the floor right away. Remove soap buildup in the bathtub or shower. Buildup makes bathtubs and showers  slippery. Use non-skid mats or decals on the floor of the bathtub or shower. Attach bath mats securely with double-sided, non-slip rug tape. If you need to sit down while you are in the shower, use a non-slip stool. Install grab bars by the toilet and in the bathtub and shower. Do not use towel bars as grab bars. What can I do in the bedroom? Make sure that you have a light by your bed that is easy to reach. Do not use any sheets or blankets on your bed that hang to the floor. Have a firm bench or chair with side arms that you can use for support when you get dressed. What can I do in the kitchen? Clean up any spills right away. If you need to reach something above you, use a sturdy step stool that has a grab bar. Keep electrical cables out of the way. Do not use floor polish or wax that makes floors slippery. What can I do with my stairs? Do not leave anything on the stairs. Make sure that you have a light switch at the top and the bottom of the stairs. Have them installed if you do not have them. Make sure that there are handrails on both sides of the stairs. Fix handrails that are broken or loose. Make sure that handrails are as long as the staircases. Install non-slip stair treads on all stairs in your home if they do not have carpet. Avoid having throw rugs at the top or bottom of stairs, or secure the rugs with carpet tape to prevent them from moving. Choose a carpet design that does not hide the edge of steps on the stairs. Make sure that carpet is firmly attached to the stairs. Fix any carpet that is loose or worn. What can I do on the outside of my home? Use bright outdoor lighting. Repair the edges of walkways and driveways and fix any cracks. Clear paths of anything that can make you trip, such as tools or rocks. Add color or contrast paint or tape to clearly mark and help you see high doorway thresholds. Trim any bushes or trees on the main path into your home. Check that  handrails are securely fastened and in good repair. Both sides of all steps should have handrails. Install guardrails along the edges of any raised decks or porches. Have leaves, snow, and ice cleared regularly. Use sand, salt, or ice melt on walkways during winter months if you live where there is ice and snow. In the garage, clean up any spills right away, including grease or oil spills. What other actions can I take? Review your medicines with your health care provider. Some medicines can make you confused or feel dizzy. This can increase your chance of falling. Wear closed-toe shoes that fit well and support  your feet. Wear shoes that have rubber soles and low heels. Use a cane, walker, scooter, or crutches that help you move around if needed. Talk with your provider about other ways that you can decrease your risk of falls. This may include seeing a physical therapist to learn to do exercises to improve movement and strength. Where to find more information Centers for Disease Control and Prevention, STEADI: TonerPromos.no [employer] on Aging: BaseRingTones.pl National Institute on Aging: BaseRingTones.pl Contact a health care provider if: You are afraid of falling at home. You feel weak, drowsy, or dizzy at home. You fall at home. Get help right away if you: Lose consciousness or have trouble moving after a fall. Have a fall that causes a head injury. These symptoms may be an emergency. Get help right away. Call 911. Do not wait to see if the symptoms will go away. Do not drive yourself to the hospital. This information is not intended to replace advice given to you by your health care provider. Make sure you discuss any questions you have with your health care provider. Document Revised: 08/02/2022 Document Reviewed: 08/02/2022 Elsevier Patient Education  2024 [employer].

## 2024-05-04 ENCOUNTER — Other Ambulatory Visit: Payer: Self-pay | Admitting: Internal Medicine

## 2024-05-04 DIAGNOSIS — I10 Essential (primary) hypertension: Secondary | ICD-10-CM

## 2024-05-08 NOTE — Telephone Encounter (Signed)
 Requested Prescriptions  Pending Prescriptions Disp Refills   triamterene -hydrochlorothiazide (MAXZIDE-25) 37.5-25 MG tablet [Pharmacy Med Name: TRIAMTERENE -HCTZ 37.5-25 MG TB] 90 tablet 0    Sig: TAKE 1 TABLET BY MOUTH EVERY DAY     Cardiovascular: Diuretic Combos Failed - 05/08/2024  3:05 PM      Failed - K in normal range and within 180 days    Potassium  Date Value Ref Range Status  08/09/2023 4.1 3.5 - 5.2 mmol/L Final  11/22/2014 4.1 3.5 - 5.1 mmol/L Final         Failed - Na in normal range and within 180 days    Sodium  Date Value Ref Range Status  08/09/2023 144 134 - 144 mmol/L Final  11/22/2014 141 136 - 145 mmol/L Final         Failed - Cr in normal range and within 180 days    Creatinine  Date Value Ref Range Status  11/22/2014 1.02 0.60 - 1.30 mg/dL Final   Creatinine, Ser  Date Value Ref Range Status  08/09/2023 0.99 0.57 - 1.00 mg/dL Final         Failed - Valid encounter within last 6 months    Recent Outpatient Visits   None     Future Appointments             In 1 month Gala Jubilee, [place], MD [facility]            Passed - Last BP in normal range    BP Readings from Last 1 Encounters:  09/16/23 134/76

## 2024-05-12 ENCOUNTER — Other Ambulatory Visit: Payer: Self-pay | Admitting: Internal Medicine

## 2024-05-12 DIAGNOSIS — I10 Essential (primary) hypertension: Secondary | ICD-10-CM

## 2024-06-08 ENCOUNTER — Encounter: Payer: Self-pay | Admitting: Internal Medicine

## 2024-06-22 ENCOUNTER — Encounter

## 2024-07-11 ENCOUNTER — Ambulatory Visit
Admission: RE | Admit: 2024-07-11 | Discharge: 2024-07-11 | Disposition: A | Discharge status: Home / Self Care | Source: Ambulatory Visit | Attending: Internal Medicine | Admitting: Internal Medicine

## 2024-07-11 DIAGNOSIS — Z1231 Encounter for screening mammogram for malignant neoplasm of breast: Secondary | ICD-10-CM | POA: Insufficient documentation

## 2024-07-12 ENCOUNTER — Other Ambulatory Visit (HOSPITAL_COMMUNITY)
Admission: RE | Admit: 2024-07-12 | Discharge: 2024-07-12 | Disposition: A | Discharge status: Home / Self Care | Source: Ambulatory Visit | Attending: Obstetrics and Gynecology | Admitting: Obstetrics and Gynecology

## 2024-07-12 ENCOUNTER — Encounter: Payer: Self-pay | Admitting: Obstetrics and Gynecology

## 2024-07-12 ENCOUNTER — Ambulatory Visit (INDEPENDENT_AMBULATORY_CARE_PROVIDER_SITE_OTHER): Admitting: Obstetrics and Gynecology

## 2024-07-12 VITALS — BP 124/72 | HR 61 | Ht 60.0 in | Wt 181.5 lb

## 2024-07-12 DIAGNOSIS — Z01419 Encounter for gynecological examination (general) (routine) without abnormal findings: Secondary | ICD-10-CM

## 2024-07-12 DIAGNOSIS — Z124 Encounter for screening for malignant neoplasm of cervix: Secondary | ICD-10-CM | POA: Insufficient documentation

## 2024-07-12 DIAGNOSIS — Z1151 Encounter for screening for human papillomavirus (HPV): Secondary | ICD-10-CM | POA: Diagnosis not present

## 2024-07-12 NOTE — Progress Notes (Signed)
 HPI:      [name] is a 68 y.o. [id number] who LMP was No LMP recorded. Patient is postmenopausal.  Subjective:   She presents today for her well woman visit.  She reports she is doing well.  She has no complaints.  She had a mammogram yesterday. A significant note she underwent a LEEP 2 years ago.  Last year her Pap was normal.  She desires another Pap this year just to be sure.    Hx: The following portions of the patient's history were reviewed and updated as appropriate:             She  has a past medical history of Anemia, Diverticulosis, and Hypertension. She does not have any pertinent problems on file. She  has a past surgical history that includes arm surgery; Cesarean section; Ganglion cyst excision (Right); Trigger finger release (Right); Colonoscopy; Tonsillectomy; Knee arthroscopy with medial menisectomy (Left, 04/22/2016); Breast biopsy (Right, 2015); Breast excisional biopsy (Right, 2015); and LEEP (N/A, 07/19/2022). Her family history includes Breast cancer in her mother; Diabetes in her father; Heart attack (age of onset: 20) in her mother; Kidney disease in her father. She  reports that she has never smoked. She has never been exposed to tobacco smoke. She has never used smokeless tobacco. She reports that she does not drink alcohol and does not use drugs. She has a current medication list which includes the following prescription(s): amlodipine , aspirin ec, cetirizine, vitamin d3, multivitamin, omega-3 fatty acids, omeprazole , triamterene -hydrochlorothiazide, zaleplon , and [DISCONTINUED] hydrochlorothiazide. She has no known allergies.       Review of Systems:  Review of Systems  Constitutional: Denied constitutional symptoms, night sweats, recent illness, fatigue, fever, insomnia and weight loss.  Eyes: Denied eye symptoms, eye pain, photophobia, vision change and visual disturbance.  Ears/Nose/Throat/Neck: Denied ear, nose, throat or neck symptoms, hearing loss,  nasal discharge, sinus congestion and sore throat.  Cardiovascular: Denied cardiovascular symptoms, arrhythmia, chest pain/pressure, edema, exercise intolerance, orthopnea and palpitations.  Respiratory: Denied pulmonary symptoms, asthma, pleuritic pain, productive sputum, cough, dyspnea and wheezing.  Gastrointestinal: Denied, gastro-esophageal reflux, melena, nausea and vomiting.  Genitourinary: Denied genitourinary symptoms including symptomatic vaginal discharge, pelvic relaxation issues, and urinary complaints.  Musculoskeletal: Denied musculoskeletal symptoms, stiffness, swelling, muscle weakness and myalgia.  Dermatologic: Denied dermatology symptoms, rash and scar.  Neurologic: Denied neurology symptoms, dizziness, headache, neck pain and syncope.  Psychiatric: Denied psychiatric symptoms, anxiety and depression.  Endocrine: Denied endocrine symptoms including hot flashes and night sweats.   Meds:   Current Outpatient Medications on File Prior to Visit  Medication Sig Dispense Refill   amLODipine  (NORVASC ) 10 MG tablet TAKE 1 TABLET BY MOUTH EVERY DAY 90 tablet 1   aspirin EC 81 MG tablet Take 81 mg by mouth daily. Swallow whole.     cetirizine (ZYRTEC) 10 MG tablet Take 10 mg by mouth daily.     Cholecalciferol (VITAMIN D3) 5000 units TABS Take 1 tablet by mouth daily.     Multiple Vitamin (MULTIVITAMIN) tablet Take 1 tablet by mouth daily.     Omega-3 Fatty Acids (FISH OIL PO) Take 1 tablet by mouth daily.     omeprazole  (PRILOSEC) 20 MG capsule Take 1 capsule (20 mg total) by mouth daily. 30 capsule 11   triamterene -hydrochlorothiazide (MAXZIDE-25) 37.5-25 MG tablet TAKE 1 TABLET BY MOUTH EVERY DAY 90 tablet 0   zaleplon  (SONATA ) 10 MG capsule TAKE 1 CAPSULE (10 MG TOTAL) BY MOUTH AT BEDTIME AS NEEDED FOR SLEEP.  30 capsule 2   [DISCONTINUED] hydrochlorothiazide (HYDRODIURIL) 25 MG tablet Take 25 mg by mouth daily.     No current facility-administered medications on file prior to  visit.     Objective:     Vitals:   07/12/24 0917  BP: 124/72  Pulse: 61    Filed Weights   07/12/24 0917  Weight: 181 lb 8 oz (82.3 kg)              Physical examination    Pelvic:   Vulva: Normal appearance.  No lesions.  Vagina: No lesions or abnormalities noted.  Support: Normal pelvic support.  Urethra No masses tenderness or scarring.  Meatus Normal size without lesions or prolapse.  Cervix: Obviously status post procedure.  Anus: Normal exam.  No lesions.  Perineum: Normal exam.  No lesions.        Bimanual   Uterus: Normal size.  Non-tender.  Mobile.  AV.  Adnexae: No masses.  Non-tender to palpation.  Cul-de-sac: Negative for abnormality.     Assessment:    [id number] Patient Active Problem List   Diagnosis Date Noted   Coronary artery disease involving native coronary artery of native heart without angina pectoris 09/09/2023   Mild hyperlipidemia 05/27/2022   Other insomnia 05/01/2020   Essential hypertension 12/28/2019   Dysplasia of cervix, low grade (CIN 1) 12/28/2019   Carpal tunnel syndrome 05/30/2014     1. Well woman exam with routine gynecological exam   2. Cervical cancer screening        Plan:            1.  Basic Screening Recommendations The basic screening recommendations for asymptomatic women were discussed with the patient during her visit.  The age-appropriate recommendations were discussed with her and the rational for the tests reviewed.  When I am informed by the patient that another primary care physician has previously obtained the age-appropriate tests and they are up-to-date, only outstanding tests are ordered and referrals given as necessary.  Abnormal results of tests will be discussed with her when all of her results are completed.  Routine preventative health maintenance measures emphasized: Exercise/Diet/Weight control, Tobacco Warnings, Alcohol/Substance use risks and Stress Management Pap performed-if this is normal she  should not require any further Pap smears. Orders No orders of the defined types were placed in this encounter.   No orders of the defined types were placed in this encounter.         F/U  Return in about 1 year (around 07/12/2025) for Annual Physical.  Alm DOROTHA Sar, M.D. 07/12/2024 9:28 AM

## 2024-07-12 NOTE — Progress Notes (Signed)
 Patients presents for annual exam today. She states wanting to have a pap smear today due to her history of LEEP. Up to date on mammogram. She states no other questions or concerns at this time.

## 2024-07-17 LAB — CYTOLOGY - PAP
Comment: NEGATIVE
Diagnosis: NEGATIVE
High risk HPV: NEGATIVE

## 2024-08-07 ENCOUNTER — Encounter: Admitting: Internal Medicine

## 2024-08-07 ENCOUNTER — Other Ambulatory Visit: Payer: Self-pay | Admitting: Internal Medicine

## 2024-08-07 DIAGNOSIS — I10 Essential (primary) hypertension: Secondary | ICD-10-CM

## 2024-08-08 NOTE — Telephone Encounter (Signed)
 Requested Prescriptions  Pending Prescriptions Disp Refills   triamterene -hydrochlorothiazide (MAXZIDE-25) 37.5-25 MG tablet [Pharmacy Med Name: TRIAMTERENE -HCTZ 37.5-25 MG TB] 90 tablet 0    Sig: TAKE 1 TABLET BY MOUTH EVERY DAY     Cardiovascular: Diuretic Combos Failed - 08/08/2024 10:15 AM      Failed - K in normal range and within 180 days    Potassium  Date Value Ref Range Status  08/09/2023 4.1 3.5 - 5.2 mmol/L Final  11/22/2014 4.1 3.5 - 5.1 mmol/L Final         Failed - Na in normal range and within 180 days    Sodium  Date Value Ref Range Status  08/09/2023 144 134 - 144 mmol/L Final  11/22/2014 141 136 - 145 mmol/L Final         Failed - Cr in normal range and within 180 days    Creatinine  Date Value Ref Range Status  11/22/2014 1.02 0.60 - 1.30 mg/dL Final   Creatinine, Ser  Date Value Ref Range Status  08/09/2023 0.99 0.57 - 1.00 mg/dL Final         Passed - Last BP in normal range    BP Readings from Last 1 Encounters:  07/12/24 124/72         Passed - Valid encounter within last 6 months    Recent Outpatient Visits   None

## 2024-08-14 ENCOUNTER — Ambulatory Visit (INDEPENDENT_AMBULATORY_CARE_PROVIDER_SITE_OTHER): Admitting: Internal Medicine

## 2024-08-14 ENCOUNTER — Encounter: Admitting: Internal Medicine

## 2024-08-14 VITALS — BP 128/78 | HR 72 | Ht 60.0 in | Wt 184.0 lb

## 2024-08-14 DIAGNOSIS — Z13820 Encounter for screening for osteoporosis: Secondary | ICD-10-CM

## 2024-08-14 DIAGNOSIS — G4709 Other insomnia: Secondary | ICD-10-CM

## 2024-08-14 DIAGNOSIS — I10 Essential (primary) hypertension: Secondary | ICD-10-CM | POA: Diagnosis not present

## 2024-08-14 DIAGNOSIS — Z0000 Encounter for general adult medical examination without abnormal findings: Secondary | ICD-10-CM | POA: Diagnosis not present

## 2024-08-14 DIAGNOSIS — I2510 Atherosclerotic heart disease of native coronary artery without angina pectoris: Secondary | ICD-10-CM | POA: Diagnosis not present

## 2024-08-14 DIAGNOSIS — Z23 Encounter for immunization: Secondary | ICD-10-CM | POA: Diagnosis not present

## 2024-08-14 DIAGNOSIS — E785 Hyperlipidemia, unspecified: Secondary | ICD-10-CM | POA: Diagnosis not present

## 2024-08-14 DIAGNOSIS — B353 Tinea pedis: Secondary | ICD-10-CM

## 2024-08-14 DIAGNOSIS — Z1231 Encounter for screening mammogram for malignant neoplasm of breast: Secondary | ICD-10-CM | POA: Diagnosis not present

## 2024-08-14 MED ORDER — ROSUVASTATIN CALCIUM 5 MG PO TABS: 5.0000 mg | ORAL_TABLET | Freq: Every day | ORAL | 0 refills | Status: DC

## 2024-08-14 MED ORDER — ZALEPLON 10 MG PO CAPS: 10.0000 mg | ORAL_CAPSULE | Freq: Every evening | ORAL | 2 refills | Status: AC | PRN

## 2024-08-14 NOTE — Patient Instructions (Signed)
 Call [facility] to schedule your Bone Density at [phone].

## 2024-08-14 NOTE — Assessment & Plan Note (Signed)
 Blood pressure is well controlled on amlodipine  and hydrochlorothiazide . No medication side effects noted. Plan to continue current medications.

## 2024-08-14 NOTE — Assessment & Plan Note (Addendum)
 Coronary CT 9/24 showing minimal proximal LAD disease (<25%), calcium  score 5.08.  Echo with normal systolic function. continue aspirin 81 mg daily.  Being followed by Cardiology.  Given omeprazole  for atypical chest pain not felt to be cardiac but she has not been taking due to lack benefit and improvement in chest symptoms. On Omega-3  but no statin.  She is agreeable to starting Crestor  5 mg for risk reduction

## 2024-08-14 NOTE — Assessment & Plan Note (Signed)
 Not on statin therapy despite coronary ca++ on scan and mildly elevated 10 yr risk. The 10-year ASCVD risk score (Arnett DK, et al., 2019) is: 8.7%   Values used to calculate the score:     Age: 68 years     Clincally relevant sex: Female     Is Non-Hispanic African American: Yes     Diabetic: No     Tobacco smoker: No     Systolic Blood Pressure: 124 mmHg     Is BP treated: Yes     HDL Cholesterol: 54 mg/dL     Total Cholesterol: 179 mg/dL

## 2024-08-14 NOTE — Progress Notes (Signed)
 Date:  08/14/2024   Name:  Chelsea [name]   DOB:  05-08-1956   MRN:  [id number]   Chief Complaint: Annual Exam Chelsea [name] is a 68 y.o. female who presents today for her Complete Annual Exam. She feels well. She reports exercising- walking/ gym. She reports she is sleeping well. Breast complaints - none.  Health Maintenance  Topic Date Due   Zoster (Shingles) Vaccine (1 of 2) Never done   DEXA scan (bone density measurement)  Never done   COVID-19 Vaccine (3 - 2025-26 season) 08/13/2024   Medicare Annual Wellness Visit  05/02/2025   Mammogram  07/11/2025   Colon Cancer Screening  08/19/2025   DTaP/Tdap/Td vaccine (2 - Td or Tdap) 05/27/2032   Pneumococcal Vaccine for age over 16  Completed   Flu Shot  Completed   Hepatitis C Screening  Completed   HPV Vaccine  Aged Out   Meningitis B Vaccine  Aged Out    Hypertension This is a chronic problem. The problem is controlled. Pertinent negatives include no chest pain, headaches, palpitations or shortness of breath. Past treatments include calcium  channel blockers and diuretics. The current treatment provides significant improvement. Hypertensive end-organ damage includes CAD/MI. There is no history of kidney disease or CVA.  Hyperlipidemia This is a chronic problem. Recent lipid tests were reviewed and are high. Pertinent negatives include no chest pain, myalgias or shortness of breath. Current antihyperlipidemic treatment includes herbal therapy (Fish oil).  Insomnia Primary symptoms: sleep disturbance, difficulty falling asleep, frequent awakening.    Rash This is a new problem. The current episode started more than 1 month ago. The problem is unchanged. The affected locations include the right foot and left foot. The rash is characterized by peeling and itchiness. She was exposed to nothing. Pertinent negatives include no congestion, cough, diarrhea, fatigue or shortness of breath. Treatments tried: just started Lotrimin cream otc  with reduction in itching.    Review of Systems  Constitutional:  Negative for fatigue and unexpected weight change.  HENT:  Positive for hearing loss (has hearing aids). Negative for congestion, ear discharge, sinus pressure and trouble swallowing.   Eyes:  Negative for visual disturbance.  Respiratory:  Negative for cough, chest tightness, shortness of breath and wheezing.   Cardiovascular:  Negative for chest pain, palpitations and leg swelling.  Gastrointestinal:  Negative for abdominal pain, constipation and diarrhea.  Genitourinary:  Negative for frequency and urgency.  Musculoskeletal:  Negative for arthralgias and myalgias.  Skin:  Positive for rash.  Neurological:  Negative for dizziness, weakness, light-headedness and headaches.  Psychiatric/Behavioral:  Positive for sleep disturbance. Negative for dysphoric mood. The patient has insomnia. The patient is not nervous/anxious.      Lab Results  Component Value Date   NA 144 08/09/2023   K 4.1 08/09/2023   CO2 26 08/09/2023   GLUCOSE 86 08/09/2023   BUN 18 08/09/2023   CREATININE 0.99 08/09/2023   CALCIUM  9.8 08/09/2023   EGFR 63 08/09/2023   GFRNONAA 77 12/28/2019   Lab Results  Component Value Date   CHOL 179 06/03/2023   HDL 54 06/03/2023   LDLCALC 110 (H) 06/03/2023   TRIG 81 06/03/2023   CHOLHDL 3.3 06/03/2023   Lab Results  Component Value Date   TSH 1.390 06/03/2023   Lab Results  Component Value Date   HGBA1C 5.4 03/09/2022   Lab Results  Component Value Date   WBC 5.2 06/03/2023   HGB 11.2 06/03/2023  HCT 35.7 06/03/2023   MCV 82 06/03/2023   PLT 308 06/03/2023   Lab Results  Component Value Date   ALT 16 06/03/2023   AST 20 06/03/2023   ALKPHOS 107 06/03/2023   BILITOT 0.3 06/03/2023   No results found for: MARIEN BOLLS, VD25OH   Patient Active Problem List   Diagnosis Date Noted   Obesity, morbid (HCC) 08/14/2024   Coronary artery disease involving native coronary  artery of native heart without angina pectoris 09/09/2023   Mild hyperlipidemia 05/27/2022   Other insomnia 05/01/2020   Essential hypertension 12/28/2019   Dysplasia of cervix, low grade (CIN 1) 12/28/2019   Carpal tunnel syndrome 05/30/2014    No Known Allergies  Past Surgical History:  Procedure Laterality Date   arm surgery     BREAST BIOPSY Right 2015   papilloma   BREAST EXCISIONAL BIOPSY Right 2015   neg   CESAREAN SECTION     COLONOSCOPY     GANGLION CYST EXCISION Right    KNEE ARTHROSCOPY WITH MEDIAL MENISECTOMY Left 04/22/2016   Procedure: KNEE ARTHROSCOPY WITH MEDIAL MENISECTOMY;  Surgeon: Ozell Flake, MD;  Location: [facility];  Service: Orthopedics;  Laterality: Left;   LEEP N/A 07/19/2022   Procedure: LOOP ELECTROSURGICAL EXCISION PROCEDURE (LEEP);  Surgeon: Janit Alm Agent, MD;  Location: [facility];  Service: Gynecology;  Laterality: N/A;   TONSILLECTOMY     TRIGGER FINGER RELEASE Right     Social History   Tobacco Use   Smoking status: Never    Passive exposure: Never   Smokeless tobacco: Never  Vaping Use   Vaping status: Never Used  Substance Use Topics   Alcohol use: No   Drug use: No     Medication list has been reviewed and updated.  Current Meds  Medication Sig   amLODipine  (NORVASC ) 10 MG tablet TAKE 1 TABLET BY MOUTH EVERY DAY   aspirin EC 81 MG tablet Take 81 mg by mouth daily. Swallow whole.   cetirizine (ZYRTEC) 10 MG tablet Take 10 mg by mouth daily.   Cholecalciferol (VITAMIN D3) 5000 units TABS Take 1 tablet by mouth daily.   Multiple Vitamin (MULTIVITAMIN) tablet Take 1 tablet by mouth daily.   Omega-3 Fatty Acids (FISH OIL PO) Take 1 tablet by mouth daily.   omeprazole  (PRILOSEC) 20 MG capsule Take 1 capsule (20 mg total) by mouth daily.   rosuvastatin  (CRESTOR ) 5 MG tablet Take 1 tablet (5 mg total) by mouth daily.   triamterene -hydrochlorothiazide (MAXZIDE-25) 37.5-25 MG tablet TAKE 1 TABLET BY MOUTH EVERY DAY   [DISCONTINUED]  zaleplon  (SONATA ) 10 MG capsule TAKE 1 CAPSULE (10 MG TOTAL) BY MOUTH AT BEDTIME AS NEEDED FOR SLEEP.       08/14/2024    2:00 PM 09/09/2023   10:51 AM 06/03/2023    9:49 AM 11/29/2022    8:17 AM  GAD 7 : Generalized Anxiety Score  Nervous, Anxious, on Edge 0 0 0 0  Control/stop worrying 0 0 0 0  Worry too much - different things 0 0 0 0  Trouble relaxing 0 0 0 0  Restless 0 0 0 0  Easily annoyed or irritable 0 0 0 0  Afraid - awful might happen 0 0 0 0  Total GAD 7 Score 0 0 0 0  Anxiety Difficulty Not difficult at all Not difficult at all Not difficult at all Not difficult at all       08/14/2024    2:00 PM 05/02/2024   10:55  AM 09/09/2023   10:51 AM  Depression screen PHQ 2/9  Decreased Interest 0 0 0  Down, Depressed, Hopeless 0 0 0  PHQ - 2 Score 0 0 0  Altered sleeping 0 0 0  Tired, decreased energy 0 0 0  Change in appetite 0 0 0  Feeling bad or failure about yourself  0 0 0  Trouble concentrating 0 0 0  Moving slowly or fidgety/restless 0 0 0  Suicidal thoughts 0 0 0  PHQ-9 Score 0 0 0  Difficult doing work/chores Not difficult at all Not difficult at all Not difficult at all    BP Readings from Last 3 Encounters:  08/14/24 128/78  07/12/24 124/72  09/16/23 134/76    Physical Exam Vitals and nursing note reviewed.  Constitutional:      General: She is not in acute distress.    Appearance: She is well-developed.  HENT:     Head: Normocephalic and atraumatic.     Right Ear: Tympanic membrane and ear canal normal.     Left Ear: Tympanic membrane and ear canal normal.     Nose:     Right Sinus: No maxillary sinus tenderness.     Left Sinus: No maxillary sinus tenderness.  Eyes:     General: No scleral icterus.       Right eye: No discharge.        Left eye: No discharge.     Conjunctiva/sclera: Conjunctivae normal.  Neck:     Thyroid : No thyromegaly.     Vascular: No carotid bruit.  Cardiovascular:     Rate and Rhythm: Normal rate and regular rhythm.      Pulses: Normal pulses.     Heart sounds: Normal heart sounds.  Pulmonary:     Effort: Pulmonary effort is normal. No respiratory distress.     Breath sounds: No wheezing.  Abdominal:     General: Bowel sounds are normal.     Palpations: Abdomen is soft.     Tenderness: There is no abdominal tenderness.  Musculoskeletal:     Cervical back: Normal range of motion. No erythema.     Right lower leg: No edema.     Left lower leg: No edema.  Lymphadenopathy:     Cervical: No cervical adenopathy.  Skin:    General: Skin is warm and dry.     Findings: Rash (on both feet - itching and peeling) present.  Neurological:     Mental Status: She is alert and oriented to person, place, and time.     Cranial Nerves: No cranial nerve deficit.     Sensory: No sensory deficit.     Deep Tendon Reflexes: Reflexes are normal and symmetric.  Psychiatric:        Attention and Perception: Attention normal.        Mood and Affect: Mood normal.     Wt Readings from Last 3 Encounters:  08/14/24 184 lb (83.5 kg)  07/12/24 181 lb 8 oz (82.3 kg)  05/02/24 180 lb (81.6 kg)    BP 128/78   Pulse 72   Ht 5' (1.524 m)   Wt 184 lb (83.5 kg)   SpO2 98%   BMI 35.94 kg/m   Assessment and Plan:  Problem List Items Addressed This Visit       Unprioritized   Coronary artery disease involving native coronary artery of native heart without angina pectoris (Chronic)    Coronary CT 9/24 showing minimal proximal LAD disease (<25%), calcium   score 5.08.  Echo with normal systolic function. continue aspirin 81 mg daily.  Being followed by Cardiology.  Given omeprazole  for atypical chest pain not felt to be cardiac but she has not been taking due to lack benefit and improvement in chest symptoms. On Omega-3  but no statin.  She is agreeable to starting Crestor  5 mg for risk reduction      Relevant Medications   rosuvastatin  (CRESTOR ) 5 MG tablet   Essential hypertension (Chronic)   Blood pressure is well  controlled on amlodipine  and hydrochlorothiazide. No medication side effects noted. Plan to continue current medications.       Relevant Medications   rosuvastatin  (CRESTOR ) 5 MG tablet   Other Relevant Orders   CBC with Differential/Platelet   Comprehensive metabolic panel with GFR   TSH   Urinalysis, Routine w reflex microscopic   Other insomnia (Chronic)   Relevant Medications   zaleplon  (SONATA ) 10 MG capsule   Mild hyperlipidemia   Not on statin therapy despite coronary ca++ on scan and mildly elevated 10 yr risk. The 10-year ASCVD risk score (Arnett DK, et al., 2019) is: 8.7%   Values used to calculate the score:     Age: 46 years     Clincally relevant sex: Female     Is Non-Hispanic African American: Yes     Diabetic: No     Tobacco smoker: No     Systolic Blood Pressure: 124 mmHg     Is BP treated: Yes     HDL Cholesterol: 54 mg/dL     Total Cholesterol: 179 mg/dL       Relevant Medications   rosuvastatin  (CRESTOR ) 5 MG tablet   Other Relevant Orders   Lipid panel   Obesity, morbid (HCC)   Other Visit Diagnoses       Annual physical exam    -  Primary   Flu vaccine today consider Shingrix at pharmacy resume regular exercise     Encounter for screening mammogram for breast cancer       recently done - BiRads 0-4     Encounter for screening for osteoporosis       patient given number to schedule     Tinea pedis of both feet       continue Lotrimin topically nightly     Encounter for immunization       Relevant Orders   Flu vaccine HIGH DOSE PF(Fluzone Trivalent) (Completed)       Return in about 3 months (around 11/13/2024), or HTN, lipids.    Leita HILARIO Adie, MD [facility] [place]

## 2024-08-15 ENCOUNTER — Ambulatory Visit: Payer: Self-pay | Admitting: Internal Medicine

## 2024-08-15 LAB — TSH: TSH: 0.915 u[IU]/mL (ref 0.450–4.500)

## 2024-08-15 LAB — CBC WITH DIFFERENTIAL/PLATELET
Basophils Absolute: 0.1 x10E3/uL (ref 0.0–0.2)
Basos: 1 %
EOS (ABSOLUTE): 0.1 x10E3/uL (ref 0.0–0.4)
Eos: 2 %
Hematocrit: 38.9 % (ref 34.0–46.6)
Hemoglobin: 11.9 g/dL (ref 11.1–15.9)
Immature Grans (Abs): 0 x10E3/uL (ref 0.0–0.1)
Immature Granulocytes: 0 %
Lymphocytes Absolute: 2.2 x10E3/uL (ref 0.7–3.1)
Lymphs: 33 %
MCH: 25.4 pg — ABNORMAL LOW (ref 26.6–33.0)
MCHC: 30.6 g/dL — ABNORMAL LOW (ref 31.5–35.7)
MCV: 83 fL (ref 79–97)
Monocytes Absolute: 0.4 x10E3/uL (ref 0.1–0.9)
Monocytes: 7 %
Neutrophils Absolute: 3.9 x10E3/uL (ref 1.4–7.0)
Neutrophils: 57 %
Platelets: 309 x10E3/uL (ref 150–450)
RBC: 4.68 x10E6/uL (ref 3.77–5.28)
RDW: 14.2 % (ref 11.7–15.4)
WBC: 6.8 x10E3/uL (ref 3.4–10.8)

## 2024-08-15 LAB — URINALYSIS, ROUTINE W REFLEX MICROSCOPIC
Bilirubin, UA: NEGATIVE
Glucose, UA: NEGATIVE
Ketones, UA: NEGATIVE
Leukocytes,UA: NEGATIVE
Nitrite, UA: NEGATIVE
Protein,UA: NEGATIVE
RBC, UA: NEGATIVE
Specific Gravity, UA: 1.018 (ref 1.005–1.030)
Urobilinogen, Ur: 0.2 mg/dL (ref 0.2–1.0)
pH, UA: 7.5 (ref 5.0–7.5)

## 2024-08-15 LAB — COMPREHENSIVE METABOLIC PANEL WITH GFR
ALT: 17 IU/L (ref 0–32)
AST: 23 IU/L (ref 0–40)
Albumin: 4.4 g/dL (ref 3.9–4.9)
Alkaline Phosphatase: 130 IU/L — ABNORMAL HIGH (ref 44–121)
BUN/Creatinine Ratio: 22 (ref 12–28)
BUN: 17 mg/dL (ref 8–27)
Bilirubin Total: 0.3 mg/dL (ref 0.0–1.2)
CO2: 23 mmol/L (ref 20–29)
Calcium: 9.3 mg/dL (ref 8.7–10.3)
Chloride: 100 mmol/L (ref 96–106)
Creatinine, Ser: 0.77 mg/dL (ref 0.57–1.00)
Globulin, Total: 2.5 g/dL (ref 1.5–4.5)
Glucose: 70 mg/dL (ref 70–99)
Potassium: 3.7 mmol/L (ref 3.5–5.2)
Sodium: 139 mmol/L (ref 134–144)
Total Protein: 6.9 g/dL (ref 6.0–8.5)
eGFR: 84 mL/min/1.73 (ref >59)

## 2024-08-15 LAB — LIPID PANEL
Chol/HDL Ratio: 3.6 ratio (ref 0.0–4.4)
Cholesterol, Total: 204 mg/dL — ABNORMAL HIGH (ref 100–199)
HDL: 57 mg/dL (ref >39)
LDL Chol Calc (NIH): 129 mg/dL — ABNORMAL HIGH (ref 0–99)
Triglycerides: 102 mg/dL (ref 0–149)
VLDL Cholesterol Cal: 18 mg/dL (ref 5–40)

## 2024-09-14 ENCOUNTER — Other Ambulatory Visit: Payer: Self-pay | Admitting: Cardiology

## 2024-10-17 ENCOUNTER — Ambulatory Visit
Admission: RE | Admit: 2024-10-17 | Discharge: 2024-10-17 | Disposition: A | Discharge status: Home / Self Care | Source: Ambulatory Visit | Attending: Internal Medicine | Admitting: Internal Medicine

## 2024-10-17 ENCOUNTER — Other Ambulatory Visit: Payer: Self-pay | Admitting: Student

## 2024-10-17 ENCOUNTER — Ambulatory Visit: Payer: Self-pay | Admitting: Internal Medicine

## 2024-10-17 DIAGNOSIS — Z780 Asymptomatic menopausal state: Secondary | ICD-10-CM | POA: Insufficient documentation

## 2024-10-31 ENCOUNTER — Ambulatory Visit (INDEPENDENT_AMBULATORY_CARE_PROVIDER_SITE_OTHER): Admitting: Internal Medicine

## 2024-10-31 VITALS — BP 118/70 | HR 77 | Ht 60.0 in | Wt 182.0 lb

## 2024-10-31 DIAGNOSIS — I10 Essential (primary) hypertension: Secondary | ICD-10-CM

## 2024-10-31 DIAGNOSIS — I2510 Atherosclerotic heart disease of native coronary artery without angina pectoris: Secondary | ICD-10-CM

## 2024-10-31 MED ORDER — TRIAMTERENE-HCTZ 37.5-25 MG PO TABS: 1.0000 | ORAL_TABLET | Freq: Every day | ORAL | 1 refills | Status: AC

## 2024-10-31 MED ORDER — AMLODIPINE BESYLATE 10 MG PO TABS: 10.0000 mg | ORAL_TABLET | Freq: Every day | ORAL | 1 refills | Status: AC

## 2024-10-31 NOTE — Progress Notes (Signed)
 Date:  10/31/2024   Name:  Chelsea [name]   DOB:  Nov 27, 1956   MRN:  [id number]   Chief Complaint: Hypertension and Hyperlipidemia  Hypertension This is a chronic problem. The problem is controlled. Pertinent negatives include no chest pain, headaches or shortness of breath. Past treatments include calcium  channel blockers and diuretics. The current treatment provides significant improvement.  Hyperlipidemia This is a chronic problem. Pertinent negatives include no chest pain or shortness of breath. Current antihyperlipidemic treatment includes statins.  She has mild CAD and started Crestor  last visit for primary prevention.  Review of Systems  Constitutional:  Negative for chills, fatigue and fever.  Respiratory:  Negative for chest tightness and shortness of breath.   Cardiovascular:  Negative for chest pain and leg swelling.  Neurological:  Negative for dizziness and headaches.     Lab Results  Component Value Date   NA 139 08/14/2024   K 3.7 08/14/2024   CO2 23 08/14/2024   GLUCOSE 70 08/14/2024   BUN 17 08/14/2024   CREATININE 0.77 08/14/2024   CALCIUM  9.3 08/14/2024   EGFR 84 08/14/2024   GFRNONAA 77 12/28/2019   Lab Results  Component Value Date   CHOL 204 (H) 08/14/2024   HDL 57 08/14/2024   LDLCALC 129 (H) 08/14/2024   TRIG 102 08/14/2024   CHOLHDL 3.6 08/14/2024   Lab Results  Component Value Date   TSH 0.915 08/14/2024   Lab Results  Component Value Date   HGBA1C 5.4 03/09/2022   Lab Results  Component Value Date   WBC 6.8 08/14/2024   HGB 11.9 08/14/2024   HCT 38.9 08/14/2024   MCV 83 08/14/2024   PLT 309 08/14/2024   Lab Results  Component Value Date   ALT 17 08/14/2024   AST 23 08/14/2024   ALKPHOS 130 (H) 08/14/2024   BILITOT 0.3 08/14/2024   No results found for: MARIEN BOLLS, VD25OH   Patient Active Problem List   Diagnosis Date Noted   Obesity, morbid (HCC) 08/14/2024   Coronary artery disease involving native  coronary artery of native heart without angina pectoris 09/09/2023   Mild hyperlipidemia 05/27/2022   Other insomnia 05/01/2020   Essential hypertension 12/28/2019   Dysplasia of cervix, low grade (CIN 1) 12/28/2019   Carpal tunnel syndrome 05/30/2014    No Known Allergies  Past Surgical History:  Procedure Laterality Date   arm surgery     BREAST BIOPSY Right 2015   papilloma   BREAST EXCISIONAL BIOPSY Right 2015   neg   CESAREAN SECTION     COLONOSCOPY     GANGLION CYST EXCISION Right    KNEE ARTHROSCOPY WITH MEDIAL MENISECTOMY Left 04/22/2016   Procedure: KNEE ARTHROSCOPY WITH MEDIAL MENISECTOMY;  Surgeon: Ozell Flake, MD;  Location: [facility];  Service: Orthopedics;  Laterality: Left;   LEEP N/A 07/19/2022   Procedure: LOOP ELECTROSURGICAL EXCISION PROCEDURE (LEEP);  Surgeon: Janit Alm Agent, MD;  Location: [facility];  Service: Gynecology;  Laterality: N/A;   TONSILLECTOMY     TRIGGER FINGER RELEASE Right     Social History   Tobacco Use   Smoking status: Never    Passive exposure: Never   Smokeless tobacco: Never  Vaping Use   Vaping status: Never Used  Substance Use Topics   Alcohol use: No   Drug use: No     Medication list has been reviewed and updated.  Current Meds  Medication Sig   aspirin EC 81 MG tablet Take 81 mg  by mouth daily. Swallow whole.   cetirizine (ZYRTEC) 10 MG tablet Take 10 mg by mouth daily.   Cholecalciferol (VITAMIN D3) 5000 units TABS Take 1 tablet by mouth daily.   Multiple Vitamin (MULTIVITAMIN) tablet Take 1 tablet by mouth daily.   Omega-3 Fatty Acids (FISH OIL PO) Take 1 tablet by mouth daily.   omeprazole  (PRILOSEC) 20 MG capsule Take 1 capsule (20 mg total) by mouth daily.   rosuvastatin  (CRESTOR ) 5 MG tablet Take 1 tablet (5 mg total) by mouth daily.   zaleplon  (SONATA ) 10 MG capsule Take 1 capsule (10 mg total) by mouth at bedtime as needed for sleep.   [DISCONTINUED] amLODipine  (NORVASC ) 10 MG tablet TAKE 1 TABLET BY MOUTH  EVERY DAY   [DISCONTINUED] triamterene -hydrochlorothiazide (MAXZIDE-25) 37.5-25 MG tablet TAKE 1 TABLET BY MOUTH EVERY DAY       10/31/2024    2:45 PM 08/14/2024    2:00 PM 09/09/2023   10:51 AM 06/03/2023    9:49 AM  GAD 7 : Generalized Anxiety Score  Nervous, Anxious, on Edge 0 0 0 0  Control/stop worrying 0 0 0 0  Worry too much - different things 0 0 0 0  Trouble relaxing 0 0 0 0  Restless 0 0 0 0  Easily annoyed or irritable 0 0 0 0  Afraid - awful might happen 0 0 0 0  Total GAD 7 Score 0 0 0 0  Anxiety Difficulty Not difficult at all Not difficult at all Not difficult at all Not difficult at all       10/31/2024    2:45 PM 08/14/2024    2:00 PM 05/02/2024   10:55 AM  Depression screen PHQ 2/9  Decreased Interest 0 0 0  Down, Depressed, Hopeless 0 0 0  PHQ - 2 Score 0 0 0  Altered sleeping 0 0 0  Tired, decreased energy 0 0 0  Change in appetite 0 0 0  Feeling bad or failure about yourself  0 0 0  Trouble concentrating 0 0 0  Moving slowly or fidgety/restless 0 0 0  Suicidal thoughts 0 0 0  PHQ-9 Score 0 0  0   Difficult doing work/chores Not difficult at all Not difficult at all Not difficult at all     Data saved with a previous flowsheet row definition    BP Readings from Last 3 Encounters:  10/31/24 118/70  08/14/24 128/78  07/12/24 124/72    Physical Exam Vitals and nursing note reviewed.  Constitutional:      General: She is not in acute distress.    Appearance: Normal appearance. She is well-developed.  HENT:     Head: Normocephalic and atraumatic.  Neck:     Vascular: No carotid bruit.  Cardiovascular:     Rate and Rhythm: Normal rate and regular rhythm.  Pulmonary:     Effort: Pulmonary effort is normal. No respiratory distress.     Breath sounds: No wheezing or rhonchi.  Musculoskeletal:     Cervical back: Normal range of motion.     Right lower leg: No edema.     Left lower leg: No edema.  Lymphadenopathy:     Cervical: No cervical  adenopathy.  Skin:    General: Skin is warm and dry.     Findings: No rash.  Neurological:     General: No focal deficit present.     Mental Status: She is alert and oriented to person, place, and time.  Psychiatric:  Mood and Affect: Mood normal.        Behavior: Behavior normal.     Wt Readings from Last 3 Encounters:  10/31/24 182 lb (82.6 kg)  08/14/24 184 lb (83.5 kg)  07/12/24 181 lb 8 oz (82.3 kg)    BP 118/70   Pulse 77   Ht 5' (1.524 m)   Wt 182 lb (82.6 kg)   SpO2 97%   BMI 35.54 kg/m   Assessment and Plan:  Problem List Items Addressed This Visit       Unprioritized   Essential hypertension (Chronic)   Well controlled blood pressure today. Current regimen is amlodipine  and hydrochlorothiazide. No medication side effects noted.        Relevant Medications   triamterene -hydrochlorothiazide (MAXZIDE-25) 37.5-25 MG tablet   amLODipine  (NORVASC ) 10 MG tablet   Coronary artery disease involving native coronary artery of native heart without angina pectoris - Primary (Chronic)   Started Crestor  last visit for CAD and elevated coronary calcium  score. No side effects noted.  Due for labs today. Lab Results  Component Value Date   LDLCALC 129 (H) 08/14/2024        Relevant Medications   triamterene -hydrochlorothiazide (MAXZIDE-25) 37.5-25 MG tablet   amLODipine  (NORVASC ) 10 MG tablet   Other Relevant Orders   Comprehensive metabolic panel with GFR   Lipid panel    Return in about 5 months (around 03/31/2025) for HTN, lipids - Dr. Sol.    Leita HILARIO Adie, MD [facility] [place]

## 2024-10-31 NOTE — Assessment & Plan Note (Addendum)
 Started Crestor  last visit for CAD and elevated coronary calcium  score. No side effects noted.  Due for labs today. Lab Results  Component Value Date   LDLCALC 129 (H) 08/14/2024

## 2024-10-31 NOTE — Assessment & Plan Note (Signed)
 Well controlled blood pressure today. Current regimen is amlodipine  and hydrochlorothiazide .. No medication side effects noted.

## 2024-11-01 ENCOUNTER — Ambulatory Visit: Payer: Self-pay | Admitting: Internal Medicine

## 2024-11-01 DIAGNOSIS — E785 Hyperlipidemia, unspecified: Secondary | ICD-10-CM

## 2024-11-01 LAB — COMPREHENSIVE METABOLIC PANEL WITH GFR
ALT: 18 IU/L (ref 0–32)
AST: 21 IU/L (ref 0–40)
Albumin: 4.4 g/dL (ref 3.9–4.9)
Alkaline Phosphatase: 123 IU/L (ref 49–135)
BUN/Creatinine Ratio: 18 (ref 12–28)
BUN: 17 mg/dL (ref 8–27)
Bilirubin Total: 0.3 mg/dL (ref 0.0–1.2)
CO2: 25 mmol/L (ref 20–29)
Calcium: 9.3 mg/dL (ref 8.7–10.3)
Chloride: 101 mmol/L (ref 96–106)
Creatinine, Ser: 0.93 mg/dL (ref 0.57–1.00)
Globulin, Total: 2.4 g/dL (ref 1.5–4.5)
Glucose: 76 mg/dL (ref 70–99)
Potassium: 3.6 mmol/L (ref 3.5–5.2)
Sodium: 140 mmol/L (ref 134–144)
Total Protein: 6.8 g/dL (ref 6.0–8.5)
eGFR: 67 mL/min/1.73 (ref >59)

## 2024-11-01 LAB — LIPID PANEL
Chol/HDL Ratio: 2.4 ratio (ref 0.0–4.4)
Cholesterol, Total: 145 mg/dL (ref 100–199)
HDL: 60 mg/dL (ref >39)
LDL Chol Calc (NIH): 66 mg/dL (ref 0–99)
Triglycerides: 107 mg/dL (ref 0–149)
VLDL Cholesterol Cal: 19 mg/dL (ref 5–40)

## 2024-11-01 MED ORDER — ROSUVASTATIN CALCIUM 5 MG PO TABS: 5.0000 mg | ORAL_TABLET | Freq: Every day | ORAL | 1 refills | Status: AC

## 2024-11-20 DIAGNOSIS — J209 Acute bronchitis, unspecified: Secondary | ICD-10-CM | POA: Diagnosis not present

## 2024-11-28 ENCOUNTER — Other Ambulatory Visit: Payer: Self-pay | Admitting: Pharmacist

## 2024-11-28 NOTE — Progress Notes (Signed)
° °  11/28/2024  Patient ID: Chelsea [name], female   DOB: 1956/02/14, 68 y.o.   MRN: [id number]  This patient is appearing on a report for being at risk of failing the adherence measure for cholesterol (statin) medications this calendar year.   Medication: rosuvastatin  5 mg Last fill date: 11/01/2024 for 90 day supply  Insurance report was not up to date. No action needed at this time.   Sharyle Sia, PharmD, [facility] [phone]

## 2025-04-01 ENCOUNTER — Ambulatory Visit: Admitting: Family Medicine

## 2025-05-08 ENCOUNTER — Ambulatory Visit
